# Patient Record
Sex: Female | Born: 1950 | Race: Black or African American | Hispanic: No | Smoking: Former smoker
Health system: Southern US, Community
[De-identification: ages and names within clinical notes are randomized; demographics above are authoritative.]

## PROBLEM LIST (undated history)

## (undated) HISTORY — PX: ABDOMINAL HYSTERECTOMY: SHX81

## (undated) HISTORY — PX: WRIST SURGERY: SHX841

---

## 2010-04-29 ENCOUNTER — Inpatient Hospital Stay: Payer: Self-pay | Admitting: Specialist

## 2015-01-03 ENCOUNTER — Emergency Department
Admission: EM | Admit: 2015-01-03 | Discharge: 2015-01-03 | Disposition: A | Discharge status: Home / Self Care | Payer: Worker's Compensation | Attending: Emergency Medicine | Admitting: Emergency Medicine

## 2015-01-03 ENCOUNTER — Emergency Department: Payer: Worker's Compensation

## 2015-01-03 ENCOUNTER — Encounter: Payer: Self-pay | Admitting: Emergency Medicine

## 2015-01-03 DIAGNOSIS — S79922A Unspecified injury of left thigh, initial encounter: Secondary | ICD-10-CM | POA: Diagnosis present

## 2015-01-03 DIAGNOSIS — W19XXXA Unspecified fall, initial encounter: Secondary | ICD-10-CM | POA: Diagnosis not present

## 2015-01-03 DIAGNOSIS — Y9289 Other specified places as the place of occurrence of the external cause: Secondary | ICD-10-CM | POA: Diagnosis not present

## 2015-01-03 DIAGNOSIS — W010XXA Fall on same level from slipping, tripping and stumbling without subsequent striking against object, initial encounter: Secondary | ICD-10-CM | POA: Insufficient documentation

## 2015-01-03 DIAGNOSIS — M79605 Pain in left leg: Secondary | ICD-10-CM | POA: Diagnosis not present

## 2015-01-03 DIAGNOSIS — Y93E5 Activity, floor mopping and cleaning: Secondary | ICD-10-CM | POA: Insufficient documentation

## 2015-01-03 DIAGNOSIS — Z87891 Personal history of nicotine dependence: Secondary | ICD-10-CM | POA: Diagnosis not present

## 2015-01-03 DIAGNOSIS — Y998 Other external cause status: Secondary | ICD-10-CM | POA: Diagnosis not present

## 2015-01-03 DIAGNOSIS — S76212A Strain of adductor muscle, fascia and tendon of left thigh, initial encounter: Secondary | ICD-10-CM | POA: Insufficient documentation

## 2015-01-03 DIAGNOSIS — S76219A Strain of adductor muscle, fascia and tendon of unspecified thigh, initial encounter: Secondary | ICD-10-CM

## 2015-01-03 MED ORDER — HYDROCODONE-ACETAMINOPHEN 5-325 MG PO TABS
2.0000 | ORAL_TABLET | Freq: Once | ORAL | Status: AC
  Administered 2015-01-03: 2 via ORAL
  Filled 2015-01-03: qty 2

## 2015-01-03 MED ORDER — HYDROCODONE-ACETAMINOPHEN 5-325 MG PO TABS: 1.0000 | ORAL_TABLET | Freq: Four times a day (QID) | ORAL | Status: DC | PRN

## 2015-01-03 NOTE — ED Notes (Signed)
NAD noted at time of discharge. Pt denies questions/concerns at this time. Pt taken to the lobby via wheelchair.

## 2015-01-03 NOTE — ED Notes (Signed)
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 C/o pain to inner L thigh, radiating to buttocks and lower left back.

## 2015-01-03 NOTE — Discharge Instructions (Signed)
Groin Strain A groin strain (also called a groin pull) is an injury to the muscles or tendon on the upper inner part of the thigh. These muscles are called the adductor muscles or groin muscles. They are responsible for moving the leg across the body. A muscle strain occurs when a muscle is overstretched and some muscle fibers are torn. A groin strain can range from mild to severe depending on how many muscle fibers are affected and whether the muscle fibers are partially or completely torn.  Groin strains usually occur during exercise or participation in sports. The injury often happens when a sudden, violent force is placed on a muscle, stretching the muscle too far. A strain is more likely to occur when your muscles are not warmed up or if you are not properly conditioned. Depending on the severity of the groin strain, recovery time may vary from a few weeks to several weeks. Severe injuries often require 4-6 weeks for recovery. In these cases, complete healing can take 4-5 months.  CAUSES   Stretching the groin muscles too far or too suddenly, often during side-to-side motion with an abrupt change in direction.  Putting repeated stress on the groin muscles over a long period of time.  Performing vigorous activity without properly stretching the groin muscles beforehand. SYMPTOMS   Pain and tenderness in the groin area. This begins as sharp pain and persists as a dull ache.  Popping or snapping feeling when the injury occurs (for severe strains).  Swelling or bruising.  Muscle spasms.  Weakness in the leg.  Stiffness in the groin area with decreased ability to move the affected muscles. DIAGNOSIS  Your caregiver will perform a physical exam to diagnose a groin strain. You will be asked about your symptoms and how the injury occurred. X-rays are sometimes needed to rule out a broken bone or cartilage problems. Your caregiver may order a CT scan or MRI if a complete muscle tear is  suspected. TREATMENT  A groin strain will often heal on its own. Your caregiver may prescribe medicines to help manage pain and swelling (anti-inflammatory medicine). You may be told to use crutches for the first few days to minimize your pain. HOME CARE INSTRUCTIONS   Rest. Do not use the strained muscle if it causes pain.  Put ice on the injured area.  Put ice in a plastic bag.  Place a towel between your skin and the bag.  Leave the ice on for 15-20 minutes, every 2-3 hours. Do this for the first 2 days after the injury.  Only take over-the-counter or prescription medicines as directed by your caregiver.  Wrap the injured area with an elastic bandage as directed by your caregiver.  Keep the injured leg raised (elevated).  Walk, stretch, and perform range-of-motion exercises to improve blood flow to the injured area. Only perform these activities if you can do so without any pain. To prevent muscle strains:  Warm up before exercise.  Develop proper conditioning and strength in the groin muscles. SEEK IMMEDIATE MEDICAL CARE IF:   You have increased pain or swelling in the affected area.   Your symptoms are not improving or are getting worse.  You have any cold or numb foot  You're unable to walk You develop a fever, numbness or weakness in the muscles of the leg MAKE SURE YOU:   Understand these instructions.  Will watch your condition.  Will get help right away if you are not doing well or get worse.  Document Released: 12/18/2003 Document Revised: 04/07/2012 Document Reviewed: 12/24/2011 [facility] Patient Information 2015 [place], [place]. This information is not intended to replace advice given to you by your health care provider. Make sure you discuss any questions you have with your health care provider.

## 2015-01-03 NOTE — ED Provider Notes (Signed)
[facility] Emergency Department Provider Note  ____________________________________________  Time seen: Approximately 1:07 PM  I have reviewed the triage vital signs and the nursing notes.   HISTORY  Chief Complaint Fall    HPI Hannah Edwards is a 64 y.o. female was working mopping a floor, when she slipped and essentially "did the splits". She's been having pain in her left thigh whenever she tries to walk since that time. She feels like she very much "pulled" all of her muscles. Denies any fevers or chills. No chest pain or trouble breathing. Reports she simply slipped on a slippery floor. She denies being in significant pain while sitting still, but her pain is significant only worse over the left inner thigh whenever she tries to move. [place].   History reviewed. No pertinent past medical history.  There are no active problems to display for this patient.   Past Surgical History  Procedure Laterality Date  . Abdominal hysterectomy      Current Outpatient Rx  Name  Route  Sig  Dispense  Refill  . HYDROcodone-acetaminophen (NORCO/VICODIN) 5-325 MG per tablet   Oral   Take 1 tablet by mouth every 6 (six) hours as needed for moderate pain.   15 tablet   0     Allergies Ace inhibitors  No family history on file.  Social History Social History  Substance Use Topics  . Smoking status: Former Games developer  . Smokeless tobacco: Never Used  . Alcohol Use: No    Review of Systems Constitutional: No fever/chills Eyes: No visual changes. ENT: No sore throat. Cardiovascular: Denies chest pain. Respiratory: Denies shortness of breath. Gastrointestinal: No abdominal pain.  No nausea, no vomiting.  No diarrhea.  No constipation. Genitourinary: Negative for dysuria. Musculoskeletal: Negative for back pain. No pain in the knees, ankles or feet. Skin: Negative for rash. Neurological: Negative for headaches, focal weakness or numbness.  10-point ROS  otherwise negative.  ____________________________________________   PHYSICAL EXAM:  VITAL SIGNS: ED Triage Vitals  Enc Vitals Group     BP 01/03/15 1028 117/74 mmHg     Pulse Rate 01/03/15 1028 87     Resp 01/03/15 1028 14     Temp 01/03/15 1028 97.6 F (36.4 C)     Temp Source 01/03/15 1028 Oral     SpO2 01/03/15 1028 96 %     Weight 01/03/15 1028 270 lb (122.471 kg)     Height 01/03/15 1028 5\' 5"  (1.651 m)     Head Cir --      Peak Flow --      Pain Score 01/03/15 1027 9     Pain Loc --      Pain Edu? --      Excl. in GC? --     Constitutional: Alert and oriented. Well appearing and in no acute distress. Eyes: Conjunctivae are normal. PERRL. EOMI. Head: Atraumatic. Nose: No congestion/rhinnorhea. Mouth/Throat: Mucous membranes are moist.  Oropharynx non-erythematous. Neck: No stridor.   Cardiovascular: Normal rate, regular rhythm. Grossly normal heart sounds.  Good peripheral circulation. Respiratory: Normal respiratory effort.  No retractions. Lungs CTAB. Gastrointestinal: Soft and nontender. No distention. No abdominal bruits. No CVA tenderness. Musculoskeletal: No lower extremity tenderness nor edema except along the left inner thigh to palpation.  No joint effusions. No deformities. Normal dorsalis pedis pulses bilaterally. No lumbar or thoracic back pain. No cervical spine tenderness. Neurologic:  Normal speech and language. No gross focal neurologic deficits are appreciated. No gait instability. Skin:  Skin is warm, dry and intact. No rash noted. Psychiatric: Mood and affect are normal. Speech and behavior are normal.  ____________________________________________   LABS (all labs ordered are listed, but only abnormal results are displayed)  Labs Reviewed - No data to display ____________________________________________  EKG   ____________________________________________  RADIOLOGY     DG FEMUR MIN 2 VIEWS LEFT (Final result) Result time: 01/03/15  12:25:18   Final result by Rad Results In Interface (01/03/15 12:25:18)   Narrative:   CLINICAL DATA: Left leg pain today after falling while mopping the floor.  EXAM: LEFT FEMUR 2 VIEWS  COMPARISON: None.  FINDINGS: The hip and knee joints are maintained. Bony density at the medial femoral condyle is likely a Pellegrini-Stieda lesion. No acute hip or femur fracture is identified. No knee joint effusion.  IMPRESSION: No acute bony findings. 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    ____________________________________________   PROCEDURES  Procedure(s) performed: None  Critical Care performed: No  ____________________________________________   INITIAL IMPRESSION / ASSESSMENT AND PLAN / ED COURSE  Pertinent labs & imaging results that were available during my care of the patient were reviewed by me and considered in my medical decision making (see chart for details).  Patient reports essentially "doing the splits", and based on her exam and history I believe that she has a likely muscular strain or tear involving the left proximal thigh muscles. Her exam is very reassuring, I discussed with her pain control and close return precautions. She'll follow-up with her primary care doctor in the next few days. No evidence of acute bony injury. Neurovascular intact.  I will prescribe the patient a narcotic pain medicine due to their condition which I anticipate will cause at least moderate pain short term. I discussed with the patient safe use of narcotic pain medicines, and that they are not to drive, work in dangerous areas, or ever take more than prescribed (no more than 1 pill every 6 hours). We discussed  the risks of this type of medicine. Patient is very agreeable to only use as prescribed and to never use more than prescribed.  ____________________________________________   FINAL CLINICAL IMPRESSION(S) / ED DIAGNOSES  Final  diagnoses:  Fall  Groin strain, initial encounter      [name], MD 01/03/15 1627

## 2015-01-04 DIAGNOSIS — I10 Essential (primary) hypertension: Secondary | ICD-10-CM | POA: Insufficient documentation

## 2015-01-04 DIAGNOSIS — K219 Gastro-esophageal reflux disease without esophagitis: Secondary | ICD-10-CM

## 2015-01-04 DIAGNOSIS — E669 Obesity, unspecified: Secondary | ICD-10-CM

## 2015-01-04 DIAGNOSIS — J309 Allergic rhinitis, unspecified: Secondary | ICD-10-CM

## 2015-01-04 DIAGNOSIS — M8580 Other specified disorders of bone density and structure, unspecified site: Secondary | ICD-10-CM | POA: Insufficient documentation

## 2015-01-04 DIAGNOSIS — E785 Hyperlipidemia, unspecified: Secondary | ICD-10-CM | POA: Insufficient documentation

## 2015-01-05 ENCOUNTER — Telehealth: Payer: Self-pay | Admitting: Unknown Physician Specialty

## 2015-01-05 ENCOUNTER — Ambulatory Visit (INDEPENDENT_AMBULATORY_CARE_PROVIDER_SITE_OTHER): Payer: Medicare Other | Admitting: Unknown Physician Specialty

## 2015-01-05 ENCOUNTER — Encounter: Payer: Self-pay | Admitting: Unknown Physician Specialty

## 2015-01-05 VITALS — BP 136/87 | HR 93 | Temp 98.8°F | Ht 64.1 in | Wt 275.0 lb

## 2015-01-05 DIAGNOSIS — K219 Gastro-esophageal reflux disease without esophagitis: Secondary | ICD-10-CM

## 2015-01-05 DIAGNOSIS — E785 Hyperlipidemia, unspecified: Secondary | ICD-10-CM

## 2015-01-05 DIAGNOSIS — E669 Obesity, unspecified: Secondary | ICD-10-CM

## 2015-01-05 DIAGNOSIS — I10 Essential (primary) hypertension: Secondary | ICD-10-CM

## 2015-01-05 DIAGNOSIS — R1030 Lower abdominal pain, unspecified: Secondary | ICD-10-CM | POA: Diagnosis not present

## 2015-01-05 DIAGNOSIS — R1032 Left lower quadrant pain: Secondary | ICD-10-CM

## 2015-01-05 LAB — MICROALBUMIN, URINE WAIVED
CREATININE, URINE WAIVED: 200 mg/dL (ref 10–300)
MICROALB, UR WAIVED: 30 mg/L — AB (ref 0–19)
Microalb/Creat Ratio: <30 mg/g (ref <30)

## 2015-01-05 LAB — LIPID PANEL PICCOLO, WAIVED
CHOL/HDL RATIO PICCOLO,WAIVE: 3.3 mg/dL
Cholesterol Piccolo, Waived: 186 mg/dL (ref <200)
HDL Chol Piccolo, Waived: 56 mg/dL — ABNORMAL LOW (ref >59)
LDL Chol Calc Piccolo Waived: 116 mg/dL — ABNORMAL HIGH (ref <100)
Triglycerides Piccolo,Waived: 73 mg/dL (ref <150)
VLDL Chol Calc Piccolo,Waive: 15 mg/dL (ref <30)

## 2015-01-05 MED ORDER — CARVEDILOL 12.5 MG PO TABS: 12.5000 mg | ORAL_TABLET | Freq: Two times a day (BID) | ORAL | Status: DC

## 2015-01-05 MED ORDER — ATORVASTATIN CALCIUM 10 MG PO TABS: 10.0000 mg | ORAL_TABLET | Freq: Every day | ORAL | Status: DC

## 2015-01-05 MED ORDER — OMEPRAZOLE 20 MG PO CPDR: 20.0000 mg | DELAYED_RELEASE_CAPSULE | Freq: Every day | ORAL | Status: DC

## 2015-01-05 NOTE — Assessment & Plan Note (Signed)
Stable, continue present medications.   

## 2015-01-05 NOTE — Addendum Note (Signed)
Addended by: Gabriel Cirri on: 01/05/2015 11:20 AM   Modules accepted: Orders

## 2015-01-05 NOTE — Progress Notes (Addendum)
BP 136/87 mmHg  Pulse 93  Temp(Src) 98.8 F (37.1 C)  Ht 5' 4.1" (1.628 m)  Wt 275 lb (124.739 kg)  BMI 47.06 kg/m2  SpO2 93%   Subjective:    Patient ID: Hannah Edwards, female    DOB: 02-11-1951, 64 y.o.   MRN: [id number]  HPI: Hannah Edwards is a 64 y.o. female  Chief Complaint  Patient presents with  . ER f/u    fell at work    This is an ER f/u in which she fell at work 2 days ago in which she "did a split".  She is having pain left inner groin.  It is getting better.  Some pain with movement certain ways.  X-ray were done at the hospital which were normal.  Given Vicoden in the ER.    Also lost to follow-up and needs meds refilled  Hypertension This is a chronic problem. The current episode started today. The problem is unchanged. The problem is controlled. Pertinent negatives include no anxiety, blurred vision, chest pain, headaches, malaise/fatigue, neck pain, orthopnea, palpitations, peripheral edema, PND or shortness of breath. Past treatments include nothing. There are no compliance problems.  There is no history of angina, kidney disease, CAD/MI, CVA, heart failure, left ventricular hypertrophy, PVD or retinopathy.  Gastrophageal Reflux She complains of belching. She reports no chest pain. This is a chronic problem. The heartburn duration is more than one hour. Nothing aggravates the symptoms. She has tried a PPI for the symptoms. The treatment provided significant relief.     Relevant past medical, surgical, family and social history reviewed and updated as indicated. Interim medical history since our last visit reviewed. Allergies and medications reviewed and updated.  Review of Systems  Constitutional: Negative for malaise/fatigue.  Eyes: Negative for blurred vision.  Respiratory: Negative for shortness of breath.   Cardiovascular: Negative for chest pain, palpitations, orthopnea and PND.  Musculoskeletal: Negative for neck pain.  Neurological: Negative for  headaches.    Per HPI unless specifically indicated above     Objective:    BP 136/87 mmHg  Pulse 93  Temp(Src) 98.8 F (37.1 C)  Ht 5' 4.1" (1.628 m)  Wt 275 lb (124.739 kg)  BMI 47.06 kg/m2  SpO2 93%  Wt Readings from Last 3 Encounters:  01/05/15 275 lb (124.739 kg)  08/26/12 252 lb (114.306 kg)  01/03/15 270 lb (122.471 kg)    Physical Exam  Constitutional: She is oriented to person, place, and time. She appears well-developed and well-nourished. No distress.  HENT:  Head: Normocephalic and atraumatic.  Eyes: Conjunctivae and lids are normal. Right eye exhibits no discharge. Left eye exhibits no discharge. No scleral icterus.  Cardiovascular: Normal rate, regular rhythm and normal heart sounds.   Pulmonary/Chest: Effort normal and breath sounds normal. No respiratory distress.  Abdominal: Normal appearance. There is no splenomegaly or hepatomegaly.  Musculoskeletal: Normal range of motion.  Neurological: She is alert and oriented to person, place, and time.  Skin: Skin is intact. No rash noted. No pallor.  Psychiatric: She has a normal mood and affect. Her behavior is normal. Judgment and thought content normal.    No results found for this or any previous visit.    Assessment & Plan:   Problem List Items Addressed This Visit      Unprioritized   GERD (gastroesophageal reflux disease) - Primary    Stable, continue present medications.       Hypertension    Stable, continue present medications.  Relevant Medications   atorvastatin (LIPITOR) 10 MG tablet   Other Relevant Orders   Microalbumin, Urine Waived   Uric acid   Comprehensive metabolic panel   Obesity    Pt ed      Hyperlipidemia    Check Lipid panel      Relevant Medications   atorvastatin (LIPITOR) 10 MG tablet   Other Relevant Orders   Lipid Panel Piccolo, Waived    Other Visit Diagnoses    Left groin pain        Much better.  Able to return to work        Follow up  plan: Return in about 3 months (around 04/06/2015) for for physical.

## 2015-01-05 NOTE — Telephone Encounter (Signed)
Routing to provider. Patient was just seen this morning. 

## 2015-01-05 NOTE — Telephone Encounter (Signed)
PT NEEDS REFILL ON COREG AND PRILOSEC SENT TO [facility]

## 2015-01-05 NOTE — Assessment & Plan Note (Addendum)
Reviewed  Lipid panel.  Start Atorvastatin 10 mg QD

## 2015-01-05 NOTE — Patient Instructions (Signed)
Eliminate sugar and processed food.    Try "Eat Fat Get Thin"  "Sugar Busters"  Think you're too busy to work out? We have the workout for you. In minutes, high-intensity interval training (H.I.I.T.) will have you sweating, breathing hard and maximizing the health benefits of exercise without the time commitment. Best of all, it's scientifically proven to work.  What Is H.I.I.T.? SHORT WORKOUTS 101 High-intensity interval training - referred to as H.I.I.T. - is based on the idea that short bursts of strenuous exercise can have a big impact on the body. If moderate exercise - like a 20-minute jog - is good for your heart, lungs and metabolism, H.I.I.T. packs the benefits of that workout and more into a few minutes. It may sound too good to be true, but learning this exercise technique and adapting it to your life can mean saving hours at the gym. If you think you don't have time to exercise, H.I.I.T. may be the workout for you.  You can try it with any aerobic activity you like. The principles of H.I.I.T. can be applied to running, biking, stair climbing, swimming, jumping rope, rowing, even hopping or skipping. (Yes, skipping!)  The downside? Even though H.I.I.T. lasts only minutes, the workouts are tough, requiring you to push your body near its limit.  HOW INTENSE IS HIGH INTENSITY? High-intensity exercise is obviously not a casual stroll down the street, but it's not a run-till-your-lungs-pop explosion, either. Think breathless, not winded. Heart-pounding, not exploding. Legs pumping, but not uncontrolled.  You don't need any fancy heart rate monitors to do these workouts. Use cues from your body as a guide. In the middle of a high-intensity workout you should be able to say single words, but not complete whole sentences. So, if you can keep chatting to your workout partner during this workout, pump it up a few notches.  02-21-29 Training This simple program will help you make the most of a  short workout by improving heart health and endurance. Try it with your favorite cardiovascular activity. The essentials of 02-21-29 training are simple. Run, ride or perhaps row on a rowing machine gently for 30 seconds, accelerate to a moderate pace for 20 seconds, then sprint as hard as you can for 10 seconds. (It should be called 30-20-10 training, obviously, but that is not as catchy.) Repeat.  You don't even need a stopwatch to monitor the 30-, 20-, and 10-second time changes. You can just count to yourself, which seems to make the intervals pass more quickly.  Best of all? The grueling, all-out portion of the workout lasts for only 10 seconds. C'mon, you can do anything for 10 seconds, right?  Got 10 Minutes? A solitary minute of hard work buried in 10 minutes of activity can make a big difference.  The 10-Minute Workout If you like to run, bike, row or swim - just a little bit - this workout is a great option for you. Step 1 Warm up for 2 minutes Step 2 Pedal, run or swim all-out for 20 seconds. Repeat 2 more times Warm down for 3 minutes    GET STARTED To benefit the most from really, really short workouts, you need to build the habit of doing them into your hectic life. Ideally, you'll complete the workout three times a week. The best way to build that habit is to start small and be willing to tweak your schedule where you can to accommodate your new workout.  First set up a spot in  your house for your workout, equipped with whatever you need to get the job done: sneakers, a chair, a towel, etc. Then slot your workout in before you would normally shower. (You can even do it in the bathroom.) Or wake up five minutes earlier and do it first thing in the morning, so you can head off to work feeling accomplished. Or do it during your lunch hour. Run up your office's stairs or grab a private conference room for just a few minutes. Or work it into your commute. If you walk or bike to work,  add some heavy intervals on the way home.  GET A BOOST FROM MUSIC Creating a workout playlist of high-energy tunes you love will not make your workout feel easier, but it may cause you to exercise harder without even realizing it. Best of all, if you are doing a really short workout, you need only one or two great tunes to get you through. If you are willing to try something a bit different, make your own music as you exercise. Sing, hum, clap your hands, whatever you can do to jam along to your playlist. It may give you an extra boost to finish strong.  Find a song or podcast that's the length of your really, really short workout. By the time the song is over, you're done.  Excerpted from the [place] Times Well column http://www.nytimes.com/well/guides/really-really-short-workouts?smid=fb-nytwell&smtyp=pay

## 2015-01-05 NOTE — Assessment & Plan Note (Signed)
Pt ed

## 2015-01-06 LAB — COMPREHENSIVE METABOLIC PANEL
ALBUMIN: 4.1 g/dL (ref 3.6–4.8)
ALT: 12 IU/L (ref 0–32)
AST: 16 IU/L (ref 0–40)
Albumin/Globulin Ratio: 1.5 (ref 1.1–2.5)
Alkaline Phosphatase: 73 IU/L (ref 39–117)
BUN / CREAT RATIO: 17 (ref 11–26)
BUN: 15 mg/dL (ref 8–27)
Bilirubin Total: 0.3 mg/dL (ref 0.0–1.2)
CALCIUM: 9.8 mg/dL (ref 8.7–10.3)
CHLORIDE: 104 mmol/L (ref 97–108)
CO2: 25 mmol/L (ref 18–29)
CREATININE: 0.9 mg/dL (ref 0.57–1.00)
GFR, EST AFRICAN AMERICAN: 78 mL/min/{1.73_m2} (ref >59)
GFR, EST NON AFRICAN AMERICAN: 67 mL/min/{1.73_m2} (ref >59)
GLUCOSE: 110 mg/dL — AB (ref 65–99)
Globulin, Total: 2.7 g/dL (ref 1.5–4.5)
Potassium: 4.9 mmol/L (ref 3.5–5.2)
Sodium: 144 mmol/L (ref 134–144)
TOTAL PROTEIN: 6.8 g/dL (ref 6.0–8.5)

## 2015-01-06 LAB — URIC ACID: URIC ACID: 6.3 mg/dL (ref 2.5–7.1)

## 2015-01-24 DIAGNOSIS — Z23 Encounter for immunization: Secondary | ICD-10-CM | POA: Diagnosis not present

## 2015-05-11 ENCOUNTER — Ambulatory Visit (INDEPENDENT_AMBULATORY_CARE_PROVIDER_SITE_OTHER): Payer: Medicare Other | Admitting: Unknown Physician Specialty

## 2015-05-11 ENCOUNTER — Encounter: Payer: Self-pay | Admitting: Unknown Physician Specialty

## 2015-05-11 VITALS — BP 142/83 | HR 81 | Temp 98.8°F | Ht 64.6 in | Wt 267.6 lb

## 2015-05-11 DIAGNOSIS — I10 Essential (primary) hypertension: Secondary | ICD-10-CM

## 2015-05-11 DIAGNOSIS — E669 Obesity, unspecified: Secondary | ICD-10-CM

## 2015-05-11 DIAGNOSIS — R7301 Impaired fasting glucose: Secondary | ICD-10-CM

## 2015-05-11 DIAGNOSIS — Z0000 Encounter for general adult medical examination without abnormal findings: Secondary | ICD-10-CM

## 2015-05-11 LAB — BAYER DCA HB A1C WAIVED: HB A1C (BAYER DCA - WAIVED): 6.3 % (ref <7.0)

## 2015-05-11 NOTE — Patient Instructions (Signed)
Please do call to schedule your mammogram; the number to schedule one at either [facility] or [facility] is [phone]   

## 2015-05-11 NOTE — Assessment & Plan Note (Signed)
Lost 8 pounds.  Continue working in it

## 2015-05-11 NOTE — Assessment & Plan Note (Signed)
A little high but good numbers at home.  Continue present treatment

## 2015-05-11 NOTE — Progress Notes (Signed)
BP 142/83 mmHg  Pulse 81  Temp(Src) 98.8 F (37.1 C)  Ht 5' 4.6" (1.641 m)  Wt 267 lb 9.6 oz (121.383 kg)  BMI 45.08 kg/m2  SpO2 95%  LMP  (LMP Unknown)   Subjective:    Patient ID: Hannah Edwards, female    DOB: 12/23/1950, 65 y.o.   MRN: [id number]  HPI: Hannah Edwards is a 65 y.o. female  Chief Complaint  Patient presents with  . Medicare Wellness   Functional Status Survey: Is the patient deaf or have difficulty hearing?: No Does the patient have difficulty seeing, even when wearing glasses/contacts?: No Does the patient have difficulty concentrating, remembering, or making decisions?: No Does the patient have difficulty walking or climbing stairs?: No Does the patient have difficulty dressing or bathing?: No Does the patient have difficulty doing errands alone such as visiting a doctor's office or shopping?: No  Fall Risk  05/11/2015  Falls in the past year? Yes  Number falls in past yr: 1  Injury with Fall? No   Depression screen PHQ 2/9 05/11/2015  Decreased Interest 0  Down, Depressed, Hopeless 0  PHQ - 2 Score 0   Hypertension Using medications without difficulty Average home BPs 120's/70's-80's   No problems or lightheadedness No chest pain with exertion or shortness of breath No Edema   Relevant past medical, surgical, family and social history reviewed and updated as indicated. Interim medical history since our last visit reviewed. Allergies and medications reviewed and updated.  Review of Systems  Constitutional: Negative.   HENT: Negative.   Eyes: Negative.   Respiratory: Negative.   Cardiovascular: Negative.   Gastrointestinal: Negative.   Endocrine: Negative.   Genitourinary: Negative.   Musculoskeletal: Negative.   Skin: Negative.   Allergic/Immunologic: Negative.   Neurological: Negative.   Hematological: Negative.   Psychiatric/Behavioral: Negative.     Per HPI unless specifically indicated above     Objective:    BP 142/83 mmHg   Pulse 81  Temp(Src) 98.8 F (37.1 C)  Ht 5' 4.6" (1.641 m)  Wt 267 lb 9.6 oz (121.383 kg)  BMI 45.08 kg/m2  SpO2 95%  LMP  (LMP Unknown)  Wt Readings from Last 3 Encounters:  05/11/15 267 lb 9.6 oz (121.383 kg)  01/05/15 275 lb (124.739 kg)  08/26/12 252 lb (114.306 kg)    Physical Exam  Constitutional: She is oriented to person, place, and time. She appears well-developed and well-nourished.  HENT:  Head: Normocephalic and atraumatic.  Eyes: Pupils are equal, round, and reactive to light. Right eye exhibits no discharge. Left eye exhibits no discharge. No scleral icterus.  Neck: Normal range of motion. Neck supple. Carotid bruit is not present. No thyromegaly present.  Cardiovascular: Normal rate, regular rhythm and normal heart sounds.  Exam reveals no gallop and no friction rub.   No murmur heard. Pulmonary/Chest: Effort normal and breath sounds normal. No respiratory distress. She has no wheezes. She has no rales.  Abdominal: Soft. Bowel sounds are normal. There is no tenderness. There is no rebound.  Genitourinary: No breast swelling, tenderness or discharge.  Musculoskeletal: Normal range of motion.  Lymphadenopathy:    She has no cervical adenopathy.  Neurological: She is alert and oriented to person, place, and time.  Skin: Skin is warm, dry and intact. No rash noted.  Psychiatric: She has a normal mood and affect. Her speech is normal and behavior is normal. Judgment and thought content normal. Cognition and memory are normal.    Assessment &  Plan:   Problem List Items Addressed This Visit      Unprioritized   Hypertension    A little high but good numbers at home.  Continue present treatment      Relevant Orders   Lipid Panel w/o Chol/HDL Ratio   Comprehensive metabolic panel   Obesity    Lost 8 pounds.  Continue working in it      Relevant Orders   Lipid Panel w/o Chol/HDL Ratio   Bayer DCA Hb A1c Waived   IFG (impaired fasting glucose)   Relevant Orders    Bayer DCA Hb A1c Waived    Other Visit Diagnoses    Routine general medical examination at a health care facility    -  Primary    Relevant Orders    EKG 12-Lead (Completed)    MM DIGITAL SCREENING BILATERAL    Hepatitis C antibody        Follow up plan: Return in about 6 months (around 11/08/2015).

## 2015-05-12 LAB — HEPATITIS C ANTIBODY: Hep C Virus Ab: <0.1 s/co ratio (ref 0.0–0.9)

## 2015-05-12 LAB — COMPREHENSIVE METABOLIC PANEL
A/G RATIO: 1.6 (ref 1.1–2.5)
ALT: 8 IU/L (ref 0–32)
AST: 14 IU/L (ref 0–40)
Albumin: 4 g/dL (ref 3.6–4.8)
Alkaline Phosphatase: 73 IU/L (ref 39–117)
BILIRUBIN TOTAL: 0.4 mg/dL (ref 0.0–1.2)
BUN/Creatinine Ratio: 13 (ref 11–26)
BUN: 11 mg/dL (ref 8–27)
CALCIUM: 9.5 mg/dL (ref 8.7–10.3)
CHLORIDE: 102 mmol/L (ref 96–106)
CO2: 23 mmol/L (ref 18–29)
Creatinine, Ser: 0.82 mg/dL (ref 0.57–1.00)
GFR, EST AFRICAN AMERICAN: 87 mL/min/{1.73_m2} (ref >59)
GFR, EST NON AFRICAN AMERICAN: 75 mL/min/{1.73_m2} (ref >59)
GLOBULIN, TOTAL: 2.5 g/dL (ref 1.5–4.5)
Glucose: 95 mg/dL (ref 65–99)
POTASSIUM: 4.1 mmol/L (ref 3.5–5.2)
SODIUM: 141 mmol/L (ref 134–144)
TOTAL PROTEIN: 6.5 g/dL (ref 6.0–8.5)

## 2015-05-12 LAB — LIPID PANEL W/O CHOL/HDL RATIO
Cholesterol, Total: 195 mg/dL (ref 100–199)
HDL: 46 mg/dL (ref >39)
LDL Calculated: 133 mg/dL — ABNORMAL HIGH (ref 0–99)
TRIGLYCERIDES: 82 mg/dL (ref 0–149)
VLDL Cholesterol Cal: 16 mg/dL (ref 5–40)

## 2015-05-15 ENCOUNTER — Encounter: Payer: Self-pay | Admitting: Unknown Physician Specialty

## 2015-10-03 ENCOUNTER — Other Ambulatory Visit: Payer: Self-pay | Admitting: Unknown Physician Specialty

## 2015-12-07 ENCOUNTER — Ambulatory Visit: Payer: Medicare Other | Admitting: Unknown Physician Specialty

## 2016-01-18 ENCOUNTER — Other Ambulatory Visit: Payer: Self-pay | Admitting: Unknown Physician Specialty

## 2016-01-18 NOTE — Telephone Encounter (Signed)
Your patient 

## 2016-01-25 ENCOUNTER — Ambulatory Visit: Payer: Medicare Other | Admitting: Unknown Physician Specialty

## 2016-05-09 ENCOUNTER — Ambulatory Visit (INDEPENDENT_AMBULATORY_CARE_PROVIDER_SITE_OTHER): Payer: Medicare Other | Admitting: Family Medicine

## 2016-05-09 ENCOUNTER — Encounter: Payer: Self-pay | Admitting: Family Medicine

## 2016-05-09 VITALS — BP 130/92 | HR 101 | Temp 98.8°F | Wt 275.0 lb

## 2016-05-09 DIAGNOSIS — J101 Influenza due to other identified influenza virus with other respiratory manifestations: Secondary | ICD-10-CM

## 2016-05-09 DIAGNOSIS — R509 Fever, unspecified: Secondary | ICD-10-CM | POA: Diagnosis not present

## 2016-05-09 LAB — VERITOR FLU A/B WAIVED
INFLUENZA A: POSITIVE — AB
INFLUENZA B: NEGATIVE

## 2016-05-09 MED ORDER — ALBUTEROL SULFATE HFA 108 (90 BASE) MCG/ACT IN AERS: 2.0000 | INHALATION_SPRAY | Freq: Four times a day (QID) | RESPIRATORY_TRACT | 0 refills | Status: DC | PRN

## 2016-05-09 MED ORDER — OSELTAMIVIR PHOSPHATE 75 MG PO CAPS: 75.0000 mg | ORAL_CAPSULE | Freq: Two times a day (BID) | ORAL | 0 refills | Status: DC

## 2016-05-09 NOTE — Progress Notes (Signed)
BP (!) 130/92   Pulse (!) 101   Temp 98.8 F (37.1 C)   Wt 275 lb (124.7 kg)   LMP  (LMP Unknown)   SpO2 94%   BMI 46.33 kg/m    Subjective:    Patient ID: Hannah Edwards, female    DOB: 11-Aug-1950, 66 y.o.   MRN: [id number]  HPI: Hannah Edwards is a 66 y.o. female  Chief Complaint  Patient presents with  . URI    x 4 days, aches, body pain, chest congestion, productive cough, some dizziness, ears hurt. Low grade temp Saturday.  No sore throat.   Dizziness, weakness, body aches, fever, chills, productive cough, wheezing, ear pain x 4 days. Denies CP, sore throat. Taking mucinex, robitussin DM, tylenol, and nyquil. Last dose of medicine was last night. The majority of her work is sick right now.   History reviewed. No pertinent past medical history.  Social History   Social History  . Marital status: Single    Spouse name: N/A  . Number of children: N/A  . Years of education: N/A   Occupational History  . Not on file.   Social History Main Topics  . Smoking status: Former Smoker    Types: Cigarettes    Quit date: 01/05/1975  . Smokeless tobacco: Never Used  . Alcohol use No  . Drug use: No  . Sexual activity: No   Other Topics Concern  . Not on file   Social History Narrative  . No narrative on file    Relevant past medical, surgical, family and social history reviewed and updated as indicated. Interim medical history since our last visit reviewed. Allergies and medications reviewed and updated.  Review of Systems  Constitutional: Positive for chills, diaphoresis, fatigue and fever.  HENT: Positive for congestion.   Eyes: Negative.   Respiratory: Positive for cough and wheezing.   Cardiovascular: Negative.   Gastrointestinal: Negative.   Genitourinary: Negative.   Musculoskeletal: Positive for myalgias.  Neurological: Positive for dizziness and weakness.  Psychiatric/Behavioral: Negative.     Per HPI unless specifically indicated above     Objective:    BP (!) 130/92   Pulse (!) 101   Temp 98.8 F (37.1 C)   Wt 275 lb (124.7 kg)   LMP  (LMP Unknown)   SpO2 94%   BMI 46.33 kg/m   Wt Readings from Last 3 Encounters:  05/09/16 275 lb (124.7 kg)  05/11/15 267 lb 9.6 oz (121.4 kg)  01/05/15 275 lb (124.7 kg)    Physical Exam  Constitutional: She is oriented to person, place, and time. She appears well-developed and well-nourished.  HENT:  Head: Atraumatic.  Right Ear: External ear normal.  Left Ear: External ear normal.  Oropharynx erythematous Nasal mucosa injected  Eyes: Conjunctivae are normal. Pupils are equal, round, and reactive to light.  Neck: Normal range of motion. Neck supple.  Cardiovascular: Normal rate and normal heart sounds.   Pulmonary/Chest: Effort normal. No respiratory distress. She has wheezes (moderate expiratory wheezes b/l - improved after nebulizer treatment).  Musculoskeletal: Normal range of motion.  Neurological: She is alert and oriented to person, place, and time.  Skin: Skin is warm and dry.  Psychiatric: She has a normal mood and affect. Her behavior is normal.  Nursing note and vitals reviewed.     Assessment & Plan:   Problem List Items Addressed This Visit    None    Visit Diagnoses    Influenza A    -  Primary   Modest improvement with nebulizer treatment in office. Pt wanting tamiflu despite being out of window. Will send albuterol for prn use as well.    Relevant Medications   oseltamivir (TAMIFLU) 75 MG capsule   Other Relevant Orders   Influenza A & B (STAT)       Follow up plan: Return if symptoms worsen or fail to improve, for work note today through next thursday.

## 2016-05-09 NOTE — Patient Instructions (Signed)
Follow up as needed

## 2016-05-16 ENCOUNTER — Encounter: Payer: Medicare Other | Admitting: Unknown Physician Specialty

## 2016-06-04 ENCOUNTER — Telehealth: Payer: Self-pay | Admitting: Unknown Physician Specialty

## 2016-06-04 NOTE — Telephone Encounter (Signed)
Called and let patient know what provider said.

## 2016-06-04 NOTE — Telephone Encounter (Signed)
Called and spoke with patient. She stated that she is having nasal congestion, sore throat and runny nose. States she had the flu a few weeks ago and these symptoms started Friday. Pharmacy is [facility].

## 2016-06-04 NOTE — Telephone Encounter (Signed)
Patient called to see if Hannah Edwards would call her in some medication for her cold.  She had an appt but she cancelled it due to not feeling well enough to come in the office.  Please advise.   Thanks

## 2016-06-04 NOTE — Telephone Encounter (Signed)
Let her know please that this sounds like a cold virus and antibiotics are not effective for this.  Lots of fluids and rest.

## 2016-06-04 NOTE — Telephone Encounter (Signed)
Called and left patient a VM asking for her to please return my call. We need to know what kind of symptoms she is having and how long she has not been feeling well.

## 2016-06-05 ENCOUNTER — Encounter: Payer: Self-pay | Admitting: Family Medicine

## 2016-12-10 ENCOUNTER — Encounter: Payer: Self-pay | Admitting: Unknown Physician Specialty

## 2016-12-10 ENCOUNTER — Ambulatory Visit (INDEPENDENT_AMBULATORY_CARE_PROVIDER_SITE_OTHER): Payer: Medicare Other | Admitting: Unknown Physician Specialty

## 2016-12-10 ENCOUNTER — Other Ambulatory Visit: Payer: Self-pay

## 2016-12-10 DIAGNOSIS — Z0000 Encounter for general adult medical examination without abnormal findings: Secondary | ICD-10-CM

## 2016-12-10 DIAGNOSIS — Z23 Encounter for immunization: Secondary | ICD-10-CM | POA: Diagnosis not present

## 2016-12-10 DIAGNOSIS — Z1231 Encounter for screening mammogram for malignant neoplasm of breast: Secondary | ICD-10-CM

## 2016-12-10 DIAGNOSIS — E7800 Pure hypercholesterolemia, unspecified: Secondary | ICD-10-CM | POA: Diagnosis not present

## 2016-12-10 DIAGNOSIS — E2839 Other primary ovarian failure: Secondary | ICD-10-CM | POA: Diagnosis not present

## 2016-12-10 DIAGNOSIS — I10 Essential (primary) hypertension: Secondary | ICD-10-CM | POA: Diagnosis not present

## 2016-12-10 DIAGNOSIS — R7301 Impaired fasting glucose: Secondary | ICD-10-CM | POA: Diagnosis not present

## 2016-12-10 DIAGNOSIS — H6122 Impacted cerumen, left ear: Secondary | ICD-10-CM

## 2016-12-10 DIAGNOSIS — Z1211 Encounter for screening for malignant neoplasm of colon: Secondary | ICD-10-CM

## 2016-12-10 DIAGNOSIS — Z6841 Body Mass Index (BMI) 40.0 and over, adult: Secondary | ICD-10-CM

## 2016-12-10 DIAGNOSIS — Z7189 Other specified counseling: Secondary | ICD-10-CM | POA: Diagnosis not present

## 2016-12-10 MED ORDER — CARVEDILOL 12.5 MG PO TABS: 12.5000 mg | ORAL_TABLET | Freq: Two times a day (BID) | ORAL | 1 refills | Status: DC

## 2016-12-10 MED ORDER — OMEPRAZOLE 20 MG PO CPDR: 20.0000 mg | DELAYED_RELEASE_CAPSULE | Freq: Every day | ORAL | 3 refills | Status: DC

## 2016-12-10 NOTE — Assessment & Plan Note (Signed)
Unable to irrigate.  Pt will try OTC products

## 2016-12-10 NOTE — Assessment & Plan Note (Signed)
A voluntary discussion about advance care planning including the explanation and discussion of advance directives was extensively discussed  with the patient.  Explanation about the health care proxy and Living will was reviewed   During this discussion, the patient was able to identify a health care proxy as her son and does not plan to fill out the paperwork required.  She feels her son will make the right decisions for her and no need for advance care planning.  Patient was offered a separate Advance Care Planning visit if need.

## 2016-12-10 NOTE — Assessment & Plan Note (Addendum)
Check lipid panel.  Atorvastatin made her body ache and tried 2 trials of and off.  Will consider another statin

## 2016-12-10 NOTE — Patient Instructions (Addendum)
Pneumococcal Conjugate Vaccine (PCV13) What You Need to Know 1. Why get vaccinated? Vaccination can protect both children and adults from pneumococcal disease. Pneumococcal disease is caused by bacteria that can spread from person to person through close contact. It can cause ear infections, and it can also lead to more serious infections of the:  Lungs (pneumonia),  Blood (bacteremia), and  Covering of the brain and spinal cord (meningitis).  Pneumococcal pneumonia is most common among adults. Pneumococcal meningitis can cause deafness and brain damage, and it kills about 1 child in 10 who get it. Anyone can get pneumococcal disease, but children under 81 years of age and adults 71 years and older, people with certain medical conditions, and cigarette smokers are at the highest risk. Before there was a vaccine, the [place] saw:  more than 700 cases of meningitis,  about 13,000 blood infections,  about 5 million ear infections, and  about 200 deaths  in children under 5 each year from pneumococcal disease. Since vaccine became available, severe pneumococcal disease in these children has fallen by 88%. About 18,000 older adults die of pneumococcal disease each year in the [place]. Treatment of pneumococcal infections with penicillin and other drugs is not as effective as it used to be, because some strains of the disease have become resistant to these drugs. This makes prevention of the disease, through vaccination, even more important. 2. PCV13 vaccine Pneumococcal conjugate vaccine (called PCV13) protects against 13 types of pneumococcal bacteria. PCV13 is routinely given to children at 2, 4, 6, and 65-16 months of age. It is also recommended for children and adults 44 to 23 years of age with certain health conditions, and for all adults 62 years of age and older. Your doctor can give you details. 3. Some people should not get this vaccine Anyone who has ever had a  life-threatening allergic reaction to a dose of this vaccine, to an earlier pneumococcal vaccine called PCV7, or to any vaccine containing diphtheria toxoid (for example, DTaP), should not get PCV13. Anyone with a severe allergy to any component of PCV13 should not get the vaccine. Tell your doctor if the person being vaccinated has any severe allergies. If the person scheduled for vaccination is not feeling well, your healthcare provider might decide to reschedule the shot on another day. 4. Risks of a vaccine reaction With any medicine, including vaccines, there is a chance of reactions. These are usually mild and go away on their own, but serious reactions are also possible. Problems reported following PCV13 varied by age and dose in the series. The most common problems reported among children were:  About half became drowsy after the shot, had a temporary loss of appetite, or had redness or tenderness where the shot was given.  About 1 out of 3 had swelling where the shot was given.  About 1 out of 3 had a mild fever, and about 1 in 20 had a fever over 102.85F.  Up to about 8 out of 10 became fussy or irritable.  Adults have reported pain, redness, and swelling where the shot was given; also mild fever, fatigue, headache, chills, or muscle pain. Young children who get PCV13 along with inactivated flu vaccine at the same time may be at increased risk for seizures caused by fever. Ask your doctor for more information. Problems that could happen after any vaccine:  People sometimes faint after a medical procedure, including vaccination. Sitting or lying down for about 15 minutes can help prevent  fainting, and injuries caused by a fall. Tell your doctor if you feel dizzy, or have vision changes or ringing in the ears.  Some older children and adults get severe pain in the shoulder and have difficulty moving the arm where a shot was given. This happens very rarely.  Any medication can cause a  severe allergic reaction. Such reactions from a vaccine are very rare, estimated at about 1 in a million doses, and would happen within a few minutes to a few hours after the vaccination. As with any medicine, there is a very small chance of a vaccine causing a serious injury or death. The safety of vaccines is always being monitored. For more information, visit: http://www.aguilar.org/ 5. What if there is a serious reaction? What should I look for? Look for anything that concerns you, such as signs of a severe allergic reaction, very high fever, or unusual behavior. Signs of a severe allergic reaction can include hives, swelling of the face and throat, difficulty breathing, a fast heartbeat, dizziness, and weakness-usually within a few minutes to a few hours after the vaccination. What should I do?  If you think it is a severe allergic reaction or other emergency that can't wait, call 9-1-1 or get the person to the nearest hospital. Otherwise, call your doctor.  Reactions should be reported to the Vaccine Adverse Event Reporting System (VAERS). Your doctor should file this report, or you can do it yourself through the VAERS web site at www.vaers.SamedayNews.es, or by calling 747-328-0361. ? VAERS does not give medical advice. 6. The National Vaccine Injury Compensation Program The [employer] Vaccine Injury Compensation Program (VICP) is a federal program that was created to compensate people who may have been injured by certain vaccines. Persons who believe they may have been injured by a vaccine can learn about the program and about filing a claim by calling 406-053-9291 or visiting the [place] website at GoldCloset.com.ee. There is a time limit to file a claim for compensation. 7. How can I learn more?  Ask your healthcare provider. He or she can give you the vaccine package insert or suggest other sources of information.  Call your local or state health department.  Contact the  Centers for Disease Control and Prevention (CDC): ? Call 519-145-6080 (1-800-CDC-INFO) or ? Visit CDC's website at http://hunter.com/ Vaccine Information Statement, PCV13 Vaccine (03/09/2014) This information is not intended to replace advice given to you by your health care provider. Make sure you discuss any questions you have with your health care provider. Document Released: 02/16/2006 Document Revised: 01/10/2016 Document Reviewed: 01/10/2016 Elsevier Interactive Patient Education  2017 Elsevier Inc. Pneumococcal Conjugate Vaccine (PCV13) What You Need to Know 1. Why get vaccinated? Vaccination can protect both children and adults from pneumococcal disease. Pneumococcal disease is caused by bacteria that can spread from person to person through close contact. It can cause ear infections, and it can also lead to more serious infections of the:  Lungs (pneumonia),  Blood (bacteremia), and  Covering of the brain and spinal cord (meningitis).  Pneumococcal pneumonia is most common among adults. Pneumococcal meningitis can cause deafness and brain damage, and it kills about 1 child in 10 who get it. Anyone can get pneumococcal disease, but children under 18 years of age and adults 66 years and older, people with certain medical conditions, and cigarette smokers are at the highest risk. Before there was a vaccine, the [place] saw:  more than 700 cases of meningitis,  about 13,000 blood infections,  about 5 million ear infections, and  about 200 deaths  in children under 5 each year from pneumococcal disease. Since vaccine became available, severe pneumococcal disease in these children has fallen by 88%. About 18,000 older adults die of pneumococcal disease each year in the [place]. Treatment of pneumococcal infections with penicillin and other drugs is not as effective as it used to be, because some strains of the disease have become resistant to these drugs. This makes  prevention of the disease, through vaccination, even more important. 2. PCV13 vaccine Pneumococcal conjugate vaccine (called PCV13) protects against 13 types of pneumococcal bacteria. PCV13 is routinely given to children at 2, 4, 6, and 7-17 months of age. It is also recommended for children and adults 84 to 75 years of age with certain health conditions, and for all adults 71 years of age and older. Your doctor can give you details. 3. Some people should not get this vaccine Anyone who has ever had a life-threatening allergic reaction to a dose of this vaccine, to an earlier pneumococcal vaccine called PCV7, or to any vaccine containing diphtheria toxoid (for example, DTaP), should not get PCV13. Anyone with a severe allergy to any component of PCV13 should not get the vaccine. Tell your doctor if the person being vaccinated has any severe allergies. If the person scheduled for vaccination is not feeling well, your healthcare provider might decide to reschedule the shot on another day. 4. Risks of a vaccine reaction With any medicine, including vaccines, there is a chance of reactions. These are usually mild and go away on their own, but serious reactions are also possible. Problems reported following PCV13 varied by age and dose in the series. The most common problems reported among children were:  About half became drowsy after the shot, had a temporary loss of appetite, or had redness or tenderness where the shot was given.  About 1 out of 3 had swelling where the shot was given.  About 1 out of 3 had a mild fever, and about 1 in 20 had a fever over 102.80F.  Up to about 8 out of 10 became fussy or irritable.  Adults have reported pain, redness, and swelling where the shot was given; also mild fever, fatigue, headache, chills, or muscle pain. Young children who get PCV13 along with inactivated flu vaccine at the same time may be at increased risk for seizures caused by fever. Ask your doctor  for more information. Problems that could happen after any vaccine:  People sometimes faint after a medical procedure, including vaccination. Sitting or lying down for about 15 minutes can help prevent fainting, and injuries caused by a fall. Tell your doctor if you feel dizzy, or have vision changes or ringing in the ears.  Some older children and adults get severe pain in the shoulder and have difficulty moving the arm where a shot was given. This happens very rarely.  Any medication can cause a severe allergic reaction. Such reactions from a vaccine are very rare, estimated at about 1 in a million doses, and would happen within a few minutes to a few hours after the vaccination. As with any medicine, there is a very small chance of a vaccine causing a serious injury or death. The safety of vaccines is always being monitored. For more information, visit: http://www.aguilar.org/ 5. What if there is a serious reaction? What should I look for? Look for anything that concerns you, such as signs of a severe allergic reaction, very high  fever, or unusual behavior. Signs of a severe allergic reaction can include hives, swelling of the face and throat, difficulty breathing, a fast heartbeat, dizziness, and weakness-usually within a few minutes to a few hours after the vaccination. What should I do?  If you think it is a severe allergic reaction or other emergency that can't wait, call 9-1-1 or get the person to the nearest hospital. Otherwise, call your doctor.  Reactions should be reported to the Vaccine Adverse Event Reporting System (VAERS). Your doctor should file this report, or you can do it yourself through the VAERS web site at www.vaers.SamedayNews.es, or by calling (609)156-8661. ? VAERS does not give medical advice. 6. The National Vaccine Injury Compensation Program The [employer] Vaccine Injury Compensation Program (VICP) is a federal program that was created to compensate people who may have  been injured by certain vaccines. Persons who believe they may have been injured by a vaccine can learn about the program and about filing a claim by calling (567)634-1712 or visiting the [place] website at GoldCloset.com.ee. There is a time limit to file a claim for compensation. 7. How can I learn more?  Ask your healthcare provider. He or she can give you the vaccine package insert or suggest other sources of information.  Call your local or state health department.  Contact the Centers for Disease Control and Prevention (CDC): ? Call 306-517-1138 (1-800-CDC-INFO) or ? Visit CDC's website at http://hunter.com/ Vaccine Information Statement, PCV13 Vaccine (03/09/2014) This information is not intended to replace advice given to you by your health care provider. Make sure you discuss any questions you have with your health care provider. ----------------------------------------------------- Please do call to schedule your mammogram and bone density; the number to schedule one at either [facility] or [facility] is [phone] --------------------------------------------------------   Preventive Care 3 Years and Older, Female Preventive care refers to lifestyle choices and visits with your health care provider that can promote health and wellness. What does preventive care include?  A yearly physical exam. This is also called an annual well check.  Dental exams once or twice a year.  Routine eye exams. Ask your health care provider how often you should have your eyes checked.  Personal lifestyle choices, including: ? Daily care of your teeth and gums. ? Regular physical activity. ? Eating a healthy diet. ? Avoiding tobacco and drug use. ? Limiting alcohol use. ? Practicing safe sex. ? Taking low-dose aspirin every day. ? Taking vitamin and mineral supplements as recommended by your health care provider. What happens during an annual  well check? The services and screenings done by your health care provider during your annual well check will depend on your age, overall health, lifestyle risk factors, and family history of disease. Counseling Your health care provider may ask you questions about your:  Alcohol use.  Tobacco use.  Drug use.  Emotional well-being.  Home and relationship well-being.  Sexual activity.  Eating habits.  History of falls.  Memory and ability to understand (cognition).  Work and work Statistician.  Reproductive health.  Screening You may have the following tests or measurements:  Height, weight, and BMI.  Blood pressure.  Lipid and cholesterol levels. These may be checked every 5 years, or more frequently if you are over 85 years old.  Skin check.  Lung cancer screening. You may have this screening every year starting at age 35 if you have a 30-pack-year history of smoking and currently smoke or have quit within  the past 15 years.  Fecal occult blood test (FOBT) of the stool. You may have this test every year starting at age 54.  Flexible sigmoidoscopy or colonoscopy. You may have a sigmoidoscopy every 5 years or a colonoscopy every 10 years starting at age 80.  Hepatitis C blood test.  Hepatitis B blood test.  Sexually transmitted disease (STD) testing.  Diabetes screening. This is done by checking your blood sugar (glucose) after you have not eaten for a while (fasting). You may have this done every 1-3 years.  Bone density scan. This is done to screen for osteoporosis. You may have this done starting at age 12.  Mammogram. This may be done every 1-2 years. Talk to your health care provider about how often you should have regular mammograms.  Talk with your health care provider about your test results, treatment options, and if necessary, the need for more tests. Vaccines Your health care provider may recommend certain vaccines, such as:  Influenza vaccine. This  is recommended every year.  Tetanus, diphtheria, and acellular pertussis (Tdap, Td) vaccine. You may need a Td booster every 10 years.  Varicella vaccine. You may need this if you have not been vaccinated.  Zoster vaccine. You may need this after age 35.  Measles, mumps, and rubella (MMR) vaccine. You may need at least one dose of MMR if you were born in 1957 or later. You may also need a second dose.  Pneumococcal 13-valent conjugate (PCV13) vaccine. One dose is recommended after age 64.  Pneumococcal polysaccharide (PPSV23) vaccine. One dose is recommended after age 46.  Meningococcal vaccine. You may need this if you have certain conditions.  Hepatitis A vaccine. You may need this if you have certain conditions or if you travel or work in places where you may be exposed to hepatitis A.  Hepatitis B vaccine. You may need this if you have certain conditions or if you travel or work in places where you may be exposed to hepatitis B.  Haemophilus influenzae type b (Hib) vaccine. You may need this if you have certain conditions.  Talk to your health care provider about which screenings and vaccines you need and how often you need them. This information is not intended to replace advice given to you by your health care provider. Make sure you discuss any questions you have with your health care provider. Document Released: 05/18/2015 Document Revised: 01/09/2016 Document Reviewed: 02/20/2015 Elsevier Interactive Patient Education  2017 [employer].

## 2016-12-10 NOTE — Addendum Note (Signed)
Addended by: Gabriel CirriWICKER, Terita Hejl on: 12/10/2016 11:33 AM   Modules accepted: Orders

## 2016-12-10 NOTE — Assessment & Plan Note (Signed)
Good numbers at home.  Will continue present medications

## 2016-12-10 NOTE — Progress Notes (Signed)
BP (!) 141/93   Pulse 86   Temp 98.3 F (36.8 C)   Ht 5' 4.2" (1.631 m)   Wt 275 lb 6.4 oz (124.9 kg)   LMP  (LMP Unknown)   SpO2 93%   BMI 46.98 kg/m    Subjective:    Patient ID: Hannah Edwards, female    DOB: [birth year], 66 y.o.   MRN: [id number]  HPI: Hannah Edwards is a 66 y.o. female  Chief Complaint  Patient presents with  . Medicare Wellness  . Ear Pain    pt states that ever since she had the flu in January, her left ear hurts off and on   Functional Status Survey: Is the patient deaf or have difficulty hearing?: No Does the patient have difficulty seeing, even when wearing glasses/contacts?: No Does the patient have difficulty concentrating, remembering, or making decisions?: No Does the patient have difficulty walking or climbing stairs?: No Does the patient have difficulty dressing or bathing?: No Does the patient have difficulty doing errands alone such as visiting a doctor's office or shopping?: No   Fall Risk  12/10/2016 05/11/2015  Falls in the past year? No Yes  Number falls in past yr: - 1  Injury with Fall? - No    [facility] 12/10/2016 05/11/2015  Decreased Interest 0 0  Down, Depressed, Hopeless 0 0  PHQ - 2 Score 0 0  Altered sleeping 0 -  Tired, decreased energy 0 -  Change in appetite 0 -  Feeling bad or failure about yourself  0 -  Trouble concentrating 0 -  Moving slowly or fidgety/restless 0 -  Suicidal thoughts 0 -  PHQ-9 Score 0 -   Hypertension Using medications without difficulty Average home BPs 118/83 at home  No problems or lightheadedness No chest pain with exertion or shortness of breath No Edema  Hyperlipidemia States Lipitor makes her body ache.  Tried 2 trials.   No Muscle aches  Diet compliance: kept off 10 pounds she lost Exercise: membership at [employer]  Social History   Social History  . Marital status: Single    Spouse name: N/A  . Number of children: N/A  . Years of education: N/A    Occupational History  . Not on file.   Social History Main Topics  . Smoking status: Former Smoker    Types: Cigarettes    Quit date: 01/05/1975  . Smokeless tobacco: Never Used  . Alcohol use No  . Drug use: No  . Sexual activity: No   Other Topics Concern  . Not on file   Social History Narrative  . No narrative on file   Family History  Problem Relation Age of Onset  . Hypertension Mother   . Cancer Mother   . Hypertension Father   . Aneurysm Father   . Cancer Sister   . Diabetes Brother   . HIV/AIDS Brother    Past Surgical History:  Procedure Laterality Date  . ABDOMINAL HYSTERECTOMY    . WRIST SURGERY Left     Relevant past medical, surgical, family and social history reviewed and updated as indicated. Interim medical history since our last visit reviewed. Allergies and medications reviewed and updated.  Review of Systems  HENT: Positive for ear pain.        Left ear pain as above    Per HPI unless specifically indicated above     Objective:    BP (!) 141/93   Pulse 86  Temp 98.3 F (36.8 C)   Ht 5' 4.2" (1.631 m)   Wt 275 lb 6.4 oz (124.9 kg)   LMP  (LMP Unknown)   SpO2 93%   BMI 46.98 kg/m   Wt Readings from Last 3 Encounters:  12/10/16 275 lb 6.4 oz (124.9 kg)  05/09/16 275 lb (124.7 kg)  05/11/15 267 lb 9.6 oz (121.4 kg)    Physical Exam  Constitutional: She is oriented to person, place, and time. She appears well-developed and well-nourished.  HENT:  Head: Normocephalic and atraumatic.  Eyes: Pupils are equal, round, and reactive to light. Right eye exhibits no discharge. Left eye exhibits no discharge. No scleral icterus.  Neck: Normal range of motion. Neck supple. Carotid bruit is not present. No thyromegaly present.  Cardiovascular: Normal rate, regular rhythm and normal heart sounds.  Exam reveals no gallop and no friction rub.   No murmur heard. Pulmonary/Chest: Effort normal and breath sounds normal. No respiratory distress.  She has no wheezes. She has no rales.  Abdominal: Soft. Bowel sounds are normal. There is no tenderness. There is no rebound.  Genitourinary: No breast swelling, tenderness or discharge.  Musculoskeletal: Normal range of motion.  Lymphadenopathy:    She has no cervical adenopathy.  Neurological: She is alert and oriented to person, place, and time.  Skin: Skin is warm, dry and intact. No rash noted.  Psychiatric: She has a normal mood and affect. Her speech is normal and behavior is normal. Judgment and thought content normal. Cognition and memory are normal.    Results for orders placed or performed in visit on 05/09/16  Influenza A & B (STAT)  Result Value Ref Range   Influenza A Positive (A) Negative   Influenza B Negative Negative      Assessment & Plan:   Problem List Items Addressed This Visit      Unprioritized   Advanced care planning/counseling discussion    A voluntary discussion about advance care planning including the explanation and discussion of advance directives was extensively discussed  with the patient.  Explanation about the health care proxy and Living will was reviewed   During this discussion, the patient was able to identify a health care proxy as her son and does not plan to fill out the paperwork required.  She feels her son will make the right decisions for her and no need for advance care planning.  Patient was offered a separate Advance Care Planning visit if need.         Ceruminosis, left    Unable to irrigate.  Pt will try OTC products      Hyperlipidemia    Check lipid panel.  Atorvastatin made her body ache and tried 2 trials of and off.  Will consider another statin      Relevant Orders   Lipid Panel w/o Chol/HDL Ratio   Hypertension    Good numbers at home.  Will continue present medications      IFG (impaired fasting glucose)   Relevant Orders   Bayer DCA Hb A1c Waived   Obesity   Relevant Orders   Lipid Panel w/o Chol/HDL Ratio     Other Visit Diagnoses    Need for pneumococcal vaccination    -  Primary   Relevant Orders   Pneumococcal conjugate vaccine 13-valent IM (Completed)   Routine general medical examination at a health care facility       Relevant Orders   Cologuard   Ovarian failure  Relevant Orders   DG Bone Density   Breast cancer screening by mammogram       Relevant Orders   MM DIGITAL SCREENING BILATERAL   Colon cancer screening       Relevant Orders   Cologuard       Follow up plan: Return in about 2 days (around 12/12/2016).

## 2016-12-11 ENCOUNTER — Emergency Department: Payer: Medicare Other

## 2016-12-11 ENCOUNTER — Encounter: Payer: Self-pay | Admitting: Emergency Medicine

## 2016-12-11 ENCOUNTER — Telehealth: Payer: Self-pay | Admitting: Unknown Physician Specialty

## 2016-12-11 ENCOUNTER — Emergency Department
Admission: EM | Admit: 2016-12-11 | Discharge: 2016-12-11 | Disposition: A | Discharge status: Home / Self Care | Payer: Medicare Other | Attending: Emergency Medicine | Admitting: Emergency Medicine

## 2016-12-11 DIAGNOSIS — Z79899 Other long term (current) drug therapy: Secondary | ICD-10-CM | POA: Insufficient documentation

## 2016-12-11 DIAGNOSIS — R899 Unspecified abnormal finding in specimens from other organs, systems and tissues: Secondary | ICD-10-CM

## 2016-12-11 DIAGNOSIS — I10 Essential (primary) hypertension: Secondary | ICD-10-CM | POA: Insufficient documentation

## 2016-12-11 DIAGNOSIS — R799 Abnormal finding of blood chemistry, unspecified: Secondary | ICD-10-CM | POA: Insufficient documentation

## 2016-12-11 DIAGNOSIS — R0789 Other chest pain: Secondary | ICD-10-CM | POA: Diagnosis not present

## 2016-12-11 DIAGNOSIS — Z87891 Personal history of nicotine dependence: Secondary | ICD-10-CM | POA: Insufficient documentation

## 2016-12-11 LAB — CBC
HEMATOCRIT: 40.2 % (ref 35.0–47.0)
HEMOGLOBIN: 13.3 g/dL (ref 12.0–16.0)
MCH: 29.6 pg (ref 26.0–34.0)
MCHC: 33.2 g/dL (ref 32.0–36.0)
MCV: 89.3 fL (ref 80.0–100.0)
Platelets: 383 10*3/uL (ref 150–440)
RBC: 4.5 MIL/uL (ref 3.80–5.20)
RDW: 14.8 % — ABNORMAL HIGH (ref 11.5–14.5)
WBC: 7.4 10*3/uL (ref 3.6–11.0)

## 2016-12-11 LAB — COMPREHENSIVE METABOLIC PANEL
ALBUMIN: 4.2 g/dL (ref 3.6–4.8)
ALT: 9 IU/L (ref 0–32)
AST: 39 IU/L (ref 0–40)
Albumin/Globulin Ratio: 1.3 (ref 1.2–2.2)
Alkaline Phosphatase: 78 IU/L (ref 39–117)
BUN/Creatinine Ratio: 18 (ref 12–28)
BUN: 15 mg/dL (ref 8–27)
Bilirubin Total: 0.4 mg/dL (ref 0.0–1.2)
CALCIUM: 9.5 mg/dL (ref 8.7–10.3)
CHLORIDE: 97 mmol/L (ref 96–106)
CO2: 18 mmol/L — AB (ref 20–29)
CREATININE: 0.83 mg/dL (ref 0.57–1.00)
GFR, EST AFRICAN AMERICAN: 84 mL/min/{1.73_m2} (ref >59)
GFR, EST NON AFRICAN AMERICAN: 73 mL/min/{1.73_m2} (ref >59)
Globulin, Total: 3.2 g/dL (ref 1.5–4.5)
Glucose: 87 mg/dL (ref 65–99)
Potassium: 6.7 mmol/L (ref 3.5–5.2)
Sodium: 134 mmol/L (ref 134–144)
TOTAL PROTEIN: 7.4 g/dL (ref 6.0–8.5)

## 2016-12-11 LAB — BASIC METABOLIC PANEL
ANION GAP: 8 (ref 5–15)
BUN: 16 mg/dL (ref 6–20)
CALCIUM: 9.3 mg/dL (ref 8.9–10.3)
CHLORIDE: 102 mmol/L (ref 101–111)
CO2: 27 mmol/L (ref 22–32)
Creatinine, Ser: 1 mg/dL (ref 0.44–1.00)
GFR calc non Af Amer: 57 mL/min — ABNORMAL LOW (ref >60)
Glucose, Bld: 128 mg/dL — ABNORMAL HIGH (ref 65–99)
POTASSIUM: 4.1 mmol/L (ref 3.5–5.1)
Sodium: 137 mmol/L (ref 135–145)

## 2016-12-11 LAB — LIPID PANEL W/O CHOL/HDL RATIO
Cholesterol, Total: 177 mg/dL (ref 100–199)
HDL: 43 mg/dL (ref >39)
LDL Calculated: 119 mg/dL — ABNORMAL HIGH (ref 0–99)
Triglycerides: 74 mg/dL (ref 0–149)
VLDL CHOLESTEROL CAL: 15 mg/dL (ref 5–40)

## 2016-12-11 LAB — HGB A1C W/O EAG: Hgb A1c MFr Bld: 6.3 % — ABNORMAL HIGH (ref 4.8–5.6)

## 2016-12-11 LAB — TROPONIN I

## 2016-12-11 NOTE — ED Provider Notes (Signed)
[facility] Emergency Department Provider Note  ____________________________________________   First MD Initiated Contact with Patient 12/11/16 1708     (approximate)  I have reviewed the triage vital signs and the nursing notes.   HISTORY  Chief Complaint abnormal labs    HPI Hannah Edwards is a 66 y.o. female Who presents by private vehicle for evaluation of possible hyperkalemia.  She was seen yesterday by her primary care provider, Gabriel Cirri, for an annual appointment.  Blood work was sent and she received a phone call today hat she should go to the emergency department because her potassium was 6.7.  She states that she has some discomfort in her ears and a serum and impaction on the left side for which she is going back to Ms. [facility]icker tomorrow, but she adamantly denies chest pain, shortness of breath, nausea, vomiting, abdominal pain.  She has had no problems with her kidneys in the past.  She has no symptoms for which to describe  a severity or time of onset.  History reviewed. No pertinent past medical history.  Patient Active Problem List   Diagnosis Date Noted  . Ceruminosis, left 12/10/2016  . Advanced care planning/counseling discussion 12/10/2016  . IFG (impaired fasting glucose) 05/11/2015  . GERD (gastroesophageal reflux disease) 01/04/2015  . Hypertension 01/04/2015  . Obesity 01/04/2015  . Osteopenia 01/04/2015  . Hyperlipidemia 01/04/2015  . Allergic rhinitis 01/04/2015    Past Surgical History:  Procedure Laterality Date  . ABDOMINAL HYSTERECTOMY    . WRIST SURGERY Left     Prior to Admission medications   Medication Sig Start Date End Date Taking? Authorizing Provider  albuterol (PROVENTIL HFA;VENTOLIN HFA) 108 (90 Base) MCG/ACT inhaler Inhale 2 puffs into the lungs every 6 (six) hours as needed for wheezing or shortness of breath. 05/09/16   Particia Nearing, PA-C  carvedilol (COREG) 12.5 MG tablet Take 1 tablet (12.5 mg  total) by mouth 2 (two) times daily with a meal. 12/10/16   Gabriel Cirri, NP  omeprazole (PRILOSEC) 20 MG capsule Take 1 capsule (20 mg total) by mouth daily. 12/10/16   Gabriel Cirri, NP    Allergies Ace inhibitors; Atorvastatin; and Lisinopril  Family History  Problem Relation Age of Onset  . Hypertension Mother   . Cancer Mother   . Hypertension Father   . Aneurysm Father   . Cancer Sister   . Diabetes Brother   . HIV/AIDS Brother     Social History Social History  Substance Use Topics  . Smoking status: Former Smoker    Types: Cigarettes    Quit date: 01/05/1975  . Smokeless tobacco: Never Used  . Alcohol use No    Review of Systems Constitutional: No fever/chills Cardiovascular: Denies chest pain. Respiratory: Denies shortness of breath. Gastrointestinal: No abdominal pain.  No nausea, no vomiting.  No diarrhea.  No constipation. Integumentary: Negative for rash. Neurological: Negative for headaches, focal weakness or numbness.  ____________________________________________   PHYSICAL EXAM:  VITAL SIGNS: ED Triage Vitals  Enc Vitals Group     BP 12/11/16 1452 136/75     Pulse Rate 12/11/16 1452 98     Resp 12/11/16 1452 20     Temp 12/11/16 1452 98.9 F (37.2 C)     Temp Source 12/11/16 1452 Oral     SpO2 12/11/16 1452 96 %     Weight 12/11/16 1453 124.7 kg (275 lb)     Height 12/11/16 1453 1.626 m (5\' 4" )  Head Circumference --      Peak Flow --      Pain Score 12/11/16 1458 0     Pain Loc --      Pain Edu? --      Excl. in GC? --     Constitutional: Alert and oriented. Well appearing and in no acute distress. Eyes: Conjunctivae are normal.  Head: Atraumatic. Mouth/Throat: Mucous membranes are moist. Neck: No stridor.  No meningeal signs.   Cardiovascular: Normal rate, regular rhythm. Good peripheral circulation. Grossly normal heart sounds. Respiratory: Normal respiratory effort.  No retractions. Lungs CTAB. Gastrointestinal: Soft and  nontender. No distention.  morbid obesity. Musculoskeletal: No lower extremity tenderness nor edema. No gross deformities of extremities. Neurologic:  Normal speech and language. No gross focal neurologic deficits are appreciated.  Skin:  Skin is warm, dry and intact. No rash noted. Psychiatric: Mood and affect are normal. Speech and behavior are normal.  ____________________________________________   LABS (all labs ordered are listed, but only abnormal results are displayed)  Labs Reviewed  BASIC METABOLIC PANEL - Abnormal; Notable for the following:       Result Value   Glucose, Bld 128 (*)    GFR calc non Af Amer 57 (*)    All other components within normal limits  CBC - Abnormal; Notable for the following:    RDW 14.8 (*)    All other components within normal limits  TROPONIN I   ____________________________________________  EKG  ED ECG REPORT I, Baylin Cabal, the attending physician, personally viewed and interpreted this ECG.  Date: 12/11/2016 EKG Time: 14:54 Rate: 102 Rhythm: borderline tachycardia QRS Axis: normal Intervals: normal ST/T Wave abnormalities: normal with normal T-waves Narrative Interpretation: unremarkable  ____________________________________________  RADIOLOGY   Dg Chest 2 View  Result Date: 12/11/2016 CLINICAL DATA:  Pt had dr appt yesterday, dr called this morning stating potassium levels were high and to come to ED. No chest complaints. Hx of HBP. EXAM: CHEST  2 VIEW COMPARISON:  Normal mediastinum and cardiac silhouette. Normal pulmonary vasculature. No evidence of effusion, infiltrate, or pneumothorax. No acute bony abnormality. FINDINGS: The heart size and mediastinal contours are within normal limits. Both lungs are clear. The visualized skeletal structures are unremarkable. Degenerative osteophytosis of the spine. IMPRESSION: No acute cardiopulmonary process. Electronically Signed   By: Genevive Bi M.D.   On: 12/11/2016 15:31     ____________________________________________   PROCEDURES  Critical Care performed: No   Procedure(s) performed:   Procedures   ____________________________________________   INITIAL IMPRESSION / ASSESSMENT AND PLAN / ED COURSE  Pertinent labs & imaging results that were available during my care of the patient were reviewed by me and considered in my medical decision making (see chart for details).  I reviewed the EMR and saw in the records that the sample reported as 6.7 was also noted to be hemolyzed.  Repeat potassium today was within normal limits and her EKG was reassuring.  I provided the good news to her and we both agree that no further workup was necessary.  She will follow up tomorrow for the ongoing cerumen impaction.      ____________________________________________  FINAL CLINICAL IMPRESSION(S) / ED DIAGNOSES  Final diagnoses:  Abnormal laboratory test result     MEDICATIONS GIVEN DURING THIS VISIT:  Medications - No data to display   NEW OUTPATIENT MEDICATIONS STARTED DURING THIS VISIT:  Discharge Medication List as of 12/11/2016  5:31 PM      Discharge Medication List  as of 12/11/2016  5:31 PM      Discharge Medication List as of 12/11/2016  5:31 PM       Note:  This document was prepared using Dragon voice recognition software and may include unintentional dictation errors.    Loleta RoseForbach, Arshia Rondon, MD 12/11/16 [phone]

## 2016-12-11 NOTE — ED Triage Notes (Signed)
Pt sent over from PCP for further eval of hight potassium and chest pain

## 2016-12-11 NOTE — Telephone Encounter (Signed)
Called and advised patient to go to E.R. For elevated potassium.

## 2016-12-11 NOTE — Discharge Instructions (Signed)
Fortunately your potassium was normal today in the emergency department. The elevation seen on your outpatient labs was the result of what is called hemolysis, which results in an artificially elevated number.  Your lab work was normal in the emergency department as well as your EKG and there is no indication for any additional testing at this time.

## 2016-12-11 NOTE — ED Notes (Signed)
Pt sent in for abnormal labs from yesterday. Per report k+ was 6.7. Normal today. Pt states no pain. vss.

## 2016-12-11 NOTE — Telephone Encounter (Signed)
Critical lab results. Potasium level was 6.7. Specimen was received hemolyzed.  Please Advise

## 2016-12-12 ENCOUNTER — Encounter: Payer: Self-pay | Admitting: Unknown Physician Specialty

## 2016-12-12 ENCOUNTER — Ambulatory Visit (INDEPENDENT_AMBULATORY_CARE_PROVIDER_SITE_OTHER): Payer: Medicare Other | Admitting: Unknown Physician Specialty

## 2016-12-12 VITALS — BP 124/85 | HR 91 | Temp 98.5°F | Wt 272.8 lb

## 2016-12-12 DIAGNOSIS — H6122 Impacted cerumen, left ear: Secondary | ICD-10-CM

## 2016-12-12 DIAGNOSIS — E875 Hyperkalemia: Secondary | ICD-10-CM

## 2016-12-12 NOTE — Progress Notes (Addendum)
BP 124/85   Pulse 91   Temp 98.5 F (36.9 C)   Wt 272 lb 12.8 oz (123.7 kg)   LMP  (LMP Unknown)   SpO2 98%   BMI 46.83 kg/m    Subjective:    Patient ID: Hannah Edwards, female    DOB: 12/23/1950, 66 y.o.   MRN: [id number]  HPI: Hannah Edwards is a 66 y.o. female  Chief Complaint  Patient presents with  . Cerumen Impaction    2 day f/up    Hyperkalemia Blood draw in the ER showing normal potassium.    Cerumen impaction Pretreated ears.  Ready to have them irrigated.    Relevant past medical, surgical, family and social history reviewed and updated as indicated. Interim medical history since our last visit reviewed. Allergies and medications reviewed and updated.  Review of Systems  Per HPI unless specifically indicated above     Objective:    BP 124/85   Pulse 91   Temp 98.5 F (36.9 C)   Wt 272 lb 12.8 oz (123.7 kg)   LMP  (LMP Unknown)   SpO2 98%   BMI 46.83 kg/m   Wt Readings from Last 3 Encounters:  12/19/16 276 lb 6.4 oz (125.4 kg)  12/12/16 272 lb 12.8 oz (123.7 kg)  12/11/16 275 lb (124.7 kg)    Physical Exam  Constitutional: She is oriented to person, place, and time. She appears well-developed and well-nourished. No distress.  HENT:  Head: Normocephalic and atraumatic.  Successfully irrigated left ear  Unable to irrigate right  Eyes: Conjunctivae and lids are normal. Right eye exhibits no discharge. Left eye exhibits no discharge. No scleral icterus.  Cardiovascular: Normal rate.   Pulmonary/Chest: Effort normal.  Abdominal: Normal appearance. There is no splenomegaly or hepatomegaly.  Musculoskeletal: Normal range of motion.  Neurological: She is alert and oriented to person, place, and time.  Skin: Skin is intact. No rash noted. No pallor.  Psychiatric: She has a normal mood and affect. Her behavior is normal. Judgment and thought content normal.    Results for orders placed or performed during the hospital encounter of 12/11/16  Basic  metabolic panel  Result Value Ref Range   Sodium 137 135 - 145 mmol/L   Potassium 4.1 3.5 - 5.1 mmol/L   Chloride 102 101 - 111 mmol/L   CO2 27 22 - 32 mmol/L   Glucose, Bld 128 (H) 65 - 99 mg/dL   BUN 16 6 - 20 mg/dL   Creatinine, Ser 4.091.00 0.44 - 1.00 mg/dL   Calcium 9.3 8.9 - 81.110.3 mg/dL   GFR calc non Af Amer 57 (L) >60 mL/min   GFR calc Af Amer >60 >60 mL/min   Anion gap 8 5 - 15  CBC  Result Value Ref Range   WBC 7.4 3.6 - 11.0 K/uL   RBC 4.50 3.80 - 5.20 MIL/uL   Hemoglobin 13.3 12.0 - 16.0 g/dL   HCT 91.440.2 78.235.0 - 95.647.0 %   MCV 89.3 80.0 - 100.0 fL   MCH 29.6 26.0 - 34.0 pg   MCHC 33.2 32.0 - 36.0 g/dL   RDW 21.314.8 (H) 08.611.5 - 57.814.5 %   Platelets 383 150 - 440 K/uL  Troponin I  Result Value Ref Range   Troponin I <0.03 <0.03 ng/mL      Assessment & Plan:   Problem List Items Addressed This Visit      Unprioritized   Ceruminosis, left    Discussed OTC products for  right ear       Other Visit Diagnoses    Hyperkalemia    -  Primary   resolved      Unable to irrigate right ear but able to irrigate left.  If needs right ear worked on again, will not charge for an additional visit.    Follow up plan: Return if symptoms worsen or fail to improve.

## 2016-12-12 NOTE — Patient Instructions (Signed)
Debrox -   May use cotton ball soaked in mineral oil into ear 10-20 min per week if having recurrent ear wax accumulation.

## 2016-12-12 NOTE — Assessment & Plan Note (Signed)
Discussed OTC products for right ear

## 2016-12-19 ENCOUNTER — Ambulatory Visit (INDEPENDENT_AMBULATORY_CARE_PROVIDER_SITE_OTHER): Payer: Medicare Other | Admitting: Unknown Physician Specialty

## 2016-12-19 ENCOUNTER — Encounter: Payer: Self-pay | Admitting: Unknown Physician Specialty

## 2016-12-19 VITALS — BP 118/78 | HR 89 | Temp 98.2°F | Wt 276.4 lb

## 2016-12-19 DIAGNOSIS — H6121 Impacted cerumen, right ear: Secondary | ICD-10-CM

## 2016-12-19 NOTE — Progress Notes (Signed)
BP 118/78   Pulse 89   Temp 98.2 F (36.8 C)   Wt 276 lb 6.4 oz (125.4 kg)   LMP  (LMP Unknown)   SpO2 98%   BMI 47.44 kg/m    Subjective:    Patient ID: Hannah Edwards, female    DOB: [birth year], 66 y.o.   MRN: [id number]  HPI: Hannah Edwards is a 66 y.o. female  Chief Complaint  Patient presents with  . Cerumen Impaction    right ear, patient states she has been using debrox     Relevant past medical, surgical, family and social history reviewed and updated as indicated. Interim medical history since our last visit reviewed. Allergies and medications reviewed and updated.  Review of Systems  Per HPI unless specifically indicated above     Objective:    BP 118/78   Pulse 89   Temp 98.2 F (36.8 C)   Wt 276 lb 6.4 oz (125.4 kg)   LMP  (LMP Unknown)   SpO2 98%   BMI 47.44 kg/m   Wt Readings from Last 3 Encounters:  12/19/16 276 lb 6.4 oz (125.4 kg)  12/12/16 272 lb 12.8 oz (123.7 kg)  12/11/16 275 lb (124.7 kg)    Physical Exam  Constitutional: She is oriented to person, place, and time. She appears well-developed and well-nourished. No distress.  HENT:  Head: Normocephalic and atraumatic.  Irrigated right ear and removed all wax  Eyes: Conjunctivae and lids are normal. Right eye exhibits no discharge. Left eye exhibits no discharge. No scleral icterus.  Cardiovascular: Normal rate.   Pulmonary/Chest: Effort normal.  Abdominal: Normal appearance. There is no splenomegaly or hepatomegaly.  Musculoskeletal: Normal range of motion.  Neurological: She is alert and oriented to person, place, and time.  Skin: Skin is intact. No rash noted. No pallor.  Psychiatric: She has a normal mood and affect. Her behavior is normal. Judgment and thought content normal.    Results for orders placed or performed during the hospital encounter of 12/11/16  Basic metabolic panel  Result Value Ref Range   Sodium 137 135 - 145 mmol/L   Potassium 4.1 3.5 - 5.1 mmol/L   Chloride  102 101 - 111 mmol/L   CO2 27 22 - 32 mmol/L   Glucose, Bld 128 (H) 65 - 99 mg/dL   BUN 16 6 - 20 mg/dL   Creatinine, Ser 7.82 0.44 - 1.00 mg/dL   Calcium 9.3 8.9 - 42.3 mg/dL   GFR calc non Af Amer 57 (L) >60 mL/min   GFR calc Af Amer >60 >60 mL/min   Anion gap 8 5 - 15  CBC  Result Value Ref Range   WBC 7.4 3.6 - 11.0 K/uL   RBC 4.50 3.80 - 5.20 MIL/uL   Hemoglobin 13.3 12.0 - 16.0 g/dL   HCT 53.6 14.4 - 31.5 %   MCV 89.3 80.0 - 100.0 fL   MCH 29.6 26.0 - 34.0 pg   MCHC 33.2 32.0 - 36.0 g/dL   RDW 40.0 (H) 86.7 - 61.9 %   Platelets 383 150 - 440 K/uL  Troponin I  Result Value Ref Range   Troponin I <0.03 <0.03 ng/mL      Assessment & Plan:   Problem List Items Addressed This Visit    None    Visit Diagnoses    Impacted cerumen of right ear    -  Primary       Follow up plan: Return if symptoms worsen or  fail to improve.

## 2016-12-23 NOTE — Addendum Note (Signed)
Addended by: Gabriel Cirri on: 12/23/2016 08:17 AM   Modules accepted: Level of Service

## 2017-01-22 ENCOUNTER — Ambulatory Visit
Admission: RE | Admit: 2017-01-22 | Discharge: 2017-01-22 | Disposition: A | Discharge status: Home / Self Care | Payer: Medicare Other | Source: Ambulatory Visit | Attending: Unknown Physician Specialty | Admitting: Unknown Physician Specialty

## 2017-01-22 DIAGNOSIS — R928 Other abnormal and inconclusive findings on diagnostic imaging of breast: Secondary | ICD-10-CM | POA: Insufficient documentation

## 2017-01-22 DIAGNOSIS — N6310 Unspecified lump in the right breast, unspecified quadrant: Secondary | ICD-10-CM | POA: Diagnosis not present

## 2017-01-22 DIAGNOSIS — E2839 Other primary ovarian failure: Secondary | ICD-10-CM | POA: Insufficient documentation

## 2017-01-22 DIAGNOSIS — Z1231 Encounter for screening mammogram for malignant neoplasm of breast: Secondary | ICD-10-CM | POA: Insufficient documentation

## 2017-01-22 DIAGNOSIS — Z780 Asymptomatic menopausal state: Secondary | ICD-10-CM | POA: Diagnosis not present

## 2017-01-22 DIAGNOSIS — Z13820 Encounter for screening for osteoporosis: Secondary | ICD-10-CM | POA: Diagnosis not present

## 2017-01-23 ENCOUNTER — Encounter: Payer: Self-pay | Admitting: Unknown Physician Specialty

## 2017-01-28 ENCOUNTER — Other Ambulatory Visit: Payer: Self-pay | Admitting: Unknown Physician Specialty

## 2017-01-28 DIAGNOSIS — R928 Other abnormal and inconclusive findings on diagnostic imaging of breast: Secondary | ICD-10-CM

## 2017-01-28 DIAGNOSIS — N6310 Unspecified lump in the right breast, unspecified quadrant: Secondary | ICD-10-CM

## 2017-02-02 ENCOUNTER — Ambulatory Visit
Admission: RE | Admit: 2017-02-02 | Discharge: 2017-02-02 | Disposition: A | Discharge status: Home / Self Care | Payer: Medicare Other | Source: Ambulatory Visit | Attending: Unknown Physician Specialty | Admitting: Unknown Physician Specialty

## 2017-02-02 DIAGNOSIS — N6310 Unspecified lump in the right breast, unspecified quadrant: Secondary | ICD-10-CM

## 2017-02-02 DIAGNOSIS — N6489 Other specified disorders of breast: Secondary | ICD-10-CM | POA: Diagnosis not present

## 2017-02-02 DIAGNOSIS — R928 Other abnormal and inconclusive findings on diagnostic imaging of breast: Secondary | ICD-10-CM | POA: Diagnosis not present

## 2017-02-27 DIAGNOSIS — Z1212 Encounter for screening for malignant neoplasm of rectum: Secondary | ICD-10-CM | POA: Diagnosis not present

## 2017-02-27 DIAGNOSIS — Z1211 Encounter for screening for malignant neoplasm of colon: Secondary | ICD-10-CM | POA: Diagnosis not present

## 2017-02-27 LAB — COLOGUARD: COLOGUARD: NEGATIVE

## 2017-12-11 ENCOUNTER — Ambulatory Visit: Payer: Medicare Other

## 2018-01-13 ENCOUNTER — Ambulatory Visit (INDEPENDENT_AMBULATORY_CARE_PROVIDER_SITE_OTHER): Payer: Medicare Other

## 2018-01-13 VITALS — BP 142/72 | HR 73 | Temp 98.2°F | Resp 16 | Ht 65.0 in | Wt 272.7 lb

## 2018-01-13 DIAGNOSIS — Z23 Encounter for immunization: Secondary | ICD-10-CM | POA: Diagnosis not present

## 2018-01-13 DIAGNOSIS — Z0000 Encounter for general adult medical examination without abnormal findings: Secondary | ICD-10-CM

## 2018-01-13 NOTE — Progress Notes (Signed)
Subjective:   Hannah Edwards is a 67 y.o. female who presents for Medicare Annual (Subsequent) preventive examination.  Review of Systems:  Cardiac Risk Factors include: advanced age (>42men, >63 women);hypertension;obesity (BMI >30kg/m2)     Objective:     Vitals: BP (!) 142/72 (BP Location: Left Arm, Patient Position: Sitting)   Pulse 73   Temp 98.2 F (36.8 C) (Temporal)   Resp 16   Ht 5\' 5"  (1.651 m)   Wt 272 lb 11.2 oz (123.7 kg)   LMP  (LMP Unknown)   BMI 45.38 kg/m   Body mass index is 45.38 kg/m.  Advanced Directives 01/13/2018 12/10/2016 05/11/2015 01/03/2015  Does Patient Have a Medical Advance Directive? No No No No  Would patient like information on creating a medical advance directive? Yes (MAU/Ambulatory/Procedural Areas - Information given) Yes (ED - Information included in AVS) - No - patient declined information    Tobacco Social History   Tobacco Use  Smoking Status Former Smoker  . Types: Cigarettes  . Last attempt to quit: 01/05/1975  . Years since quitting: 43.0  Smokeless Tobacco Never Used     Counseling given: Not Answered   Clinical Intake:  Pre-visit preparation completed: Yes  Pain : No/denies pain     Nutritional Status: BMI > 30  Obese Nutritional Risks: None Diabetes: No  How often do you need to have someone help you when you read instructions, pamphlets, or other written materials from your doctor or pharmacy?: 1 - Never What is the last grade level you completed in school?: bachelors degree  Interpreter Needed?: No  Information entered by :: Tiffany Hill,LPN   History reviewed. No pertinent past medical history. Past Surgical History:  Procedure Laterality Date  . ABDOMINAL HYSTERECTOMY    . WRIST SURGERY Left    Family History  Problem Relation Age of Onset  . Hypertension Mother   . Cancer Mother        unknown type   . Hypertension Father   . Aneurysm Father   . Stomach cancer Sister   . Diabetes Brother   .  HIV/AIDS Brother   . Breast cancer Neg Hx   . Lung cancer Neg Hx    Social History   Socioeconomic History  . Marital status: Widowed    Spouse name: Not on file  . Number of children: Not on file  . Years of education: Not on file  . Highest education level: Bachelor's degree (e.g., BA, AB, BS)  Occupational History  . Not on file  Social Needs  . Financial resource strain: Not hard at all  . Food insecurity:    Worry: Never true    Inability: Never true  . Transportation needs:    Medical: No    Non-medical: No  Tobacco Use  . Smoking status: Former Smoker    Types: Cigarettes    Last attempt to quit: 01/05/1975    Years since quitting: 43.0  . Smokeless tobacco: Never Used  Substance and Sexual Activity  . Alcohol use: No    Alcohol/week: 0.0 standard drinks  . Drug use: No  . Sexual activity: Never  Lifestyle  . Physical activity:    Days per week: 0 days    Minutes per session: 0 min  . Stress: Not at all  Relationships  . Social connections:    Talks on phone: More than three times a week    Gets together: More than three times a week    Attends  religious service: More than 4 times per year    Active member of club or organization: No    Attends meetings of clubs or organizations: Never    Relationship status: Widowed  Other Topics Concern  . Not on file  Social History Narrative   Working full time     Outpatient Encounter Medications as of 01/13/2018  Medication Sig  . albuterol (PROVENTIL HFA;VENTOLIN HFA) 108 (90 Base) MCG/ACT inhaler Inhale 2 puffs into the lungs every 6 (six) hours as needed for wheezing or shortness of breath.  . carvedilol (COREG) 12.5 MG tablet Take 1 tablet (12.5 mg total) by mouth 2 (two) times daily with a meal.  . omeprazole (PRILOSEC) 20 MG capsule Take 1 capsule (20 mg total) by mouth daily.   No facility-administered encounter medications on file as of 01/13/2018.     Activities of Daily Living In your present state of  health, do you have any difficulty performing the following activities: 01/13/2018  Hearing? N  Vision? N  Difficulty concentrating or making decisions? N  Walking or climbing stairs? N  Dressing or bathing? N  Doing errands, shopping? N  Preparing Food and eating ? N  Using the Toilet? N  In the past six months, have you accidently leaked urine? N  Do you have problems with loss of bowel control? N  Managing your Medications? N  Managing your Finances? N  Housekeeping or managing your Housekeeping? N  Some recent data might be hidden    Patient Care Team: Gabriel Cirri, NP as PCP - General (Nurse Practitioner)    Assessment:   This is a routine wellness examination for Hannah Edwards.  Exercise Activities and Dietary recommendations Current Exercise Habits: The patient does not participate in regular exercise at present, Exercise limited by: None identified  Goals    . DIET - INCREASE WATER INTAKE     Recommend drinking at least 6-8 glasses of water a day        Fall Risk Fall Risk  01/13/2018 12/10/2016 05/11/2015  Falls in the past year? No No Yes  Number falls in past yr: - - 1  Injury with Fall? - - No   Is the patient's home free of loose throw rugs in walkways, pet beds, electrical cords, etc?   yes      Grab bars in the bathroom? no      Handrails on the stairs?   yes      Adequate lighting?   yes  Timed Get Up and Go performed: Completed in 8 seconds with no use of assistive devices, steady gait. No intervention needed at this time.   Depression Screen PHQ 2/9 Scores 01/13/2018 12/10/2016 05/11/2015  PHQ - 2 Score 0 0 0  PHQ- 9 Score - 0 -     Cognitive Function     6CIT Screen 01/13/2018  What Year? 0 points  What month? 0 points  What time? 0 points  Count back from 20 0 points  Months in reverse 0 points  Repeat phrase 0 points  Total Score 0    Immunization History  Administered Date(s) Administered  . Influenza, High Dose Seasonal PF 01/13/2018  .  Influenza-Unspecified 02/12/2015  . PPD Test 09/09/2013  . Pneumococcal Conjugate-13 12/10/2016  . Pneumococcal Polysaccharide-23 01/13/2018  . Zoster 06/17/2010    Qualifies for Shingles Vaccine? Yes, discussed shingrix vaccine   Screening Tests Health Maintenance  Topic Date Due  . TETANUS/TDAP  05/22/1968  . MAMMOGRAM  01/23/2019  .  Fecal DNA (Cologuard)  02/28/2020  . INFLUENZA VACCINE  Completed  . DEXA SCAN  Completed  . Hepatitis C Screening  Completed  . PNA vac Low Risk Adult  Completed    Cancer Screenings: Lung: Low Dose CT Chest recommended if Age 20-80 years, 30 pack-year currently smoking OR have quit w/in 15years. Patient does not qualify. Breast:  Up to date on Mammogram? Yes  01/22/2017 Up to date of Bone Density/Dexa? Yes 01/22/2017 Colorectal: cologuard completed 02/27/2017  Additional Screenings:  Hepatitis C Screening: not indicated      Plan:    I have personally reviewed and addressed the Medicare Annual Wellness questionnaire and have noted the following in the patient's chart:  A. Medical and social history B. Use of alcohol, tobacco or illicit drugs  C. Current medications and supplements D. Functional ability and status E.  Nutritional status F.  Physical activity G. Advance directives H. List of other physicians I.  Hospitalizations, surgeries, and ER visits in previous 12 months J.  Vitals K. Screenings such as hearing and vision if needed, cognitive and depression L. Referrals and appointments   In addition, I have reviewed and discussed with patient certain preventive protocols, quality metrics, and best practice recommendations. A written personalized care plan for preventive services as well as general preventive health recommendations were provided to patient.   Signed,  Marin Roberts, LPN Nurse Health Advisor   Nurse Notes:none

## 2018-01-13 NOTE — Patient Instructions (Addendum)
[name] , Thank you for taking time to come for your Medicare Wellness Visit. I appreciate your ongoing commitment to your health goals. Please review the following plan we discussed and let me know if I can assist you in the future.   Screening recommendations/referrals: Colonoscopy: cologuard completed 02/27/2017 Mammogram: completed 01/22/2017 Bone Density: completed 01/22/2017 Recommended yearly ophthalmology/optometry visit for glaucoma screening and checkup Recommended yearly dental visit for hygiene and checkup  Vaccinations: Influenza vaccine: due now- done today Pneumococcal vaccine: pneumovax 23 due now- done today  Tdap vaccine: due, check with your insurance company for coverage Shingles vaccine: shingrix eligible, check with your insurance company for coverage     Advanced directives: Advance directive discussed with you today. I have provided a copy for you to complete at home and have notarized. Once this is complete please bring a copy in to our office so we can scan it into your chart.  Conditions/risks identified: Recommend drinking at least 6-8 glasses of water a day   Next appointment: Follow up in one year for your annual wellness exam.    Preventive Care 65 Years and Older, Female Preventive care refers to lifestyle choices and visits with your health care provider that can promote health and wellness. What does preventive care include?  A yearly physical exam. This is also called an annual well check.  Dental exams once or twice a year.  Routine eye exams. Ask your health care provider how often you should have your eyes checked.  Personal lifestyle choices, including:  Daily care of your teeth and gums.  Regular physical activity.  Eating a healthy diet.  Avoiding tobacco and drug use.  Limiting alcohol use.  Practicing safe sex.  Taking low-dose aspirin every day.  Taking vitamin and mineral supplements as recommended by your health care  provider. What happens during an annual well check? The services and screenings done by your health care provider during your annual well check will depend on your age, overall health, lifestyle risk factors, and family history of disease. Counseling  Your health care provider may ask you questions about your:  Alcohol use.  Tobacco use.  Drug use.  Emotional well-being.  Home and relationship well-being.  Sexual activity.  Eating habits.  History of falls.  Memory and ability to understand (cognition).  Work and work Astronomer.  Reproductive health. Screening  You may have the following tests or measurements:  Height, weight, and BMI.  Blood pressure.  Lipid and cholesterol levels. These may be checked every 5 years, or more frequently if you are over 76 years old.  Skin check.  Lung cancer screening. You may have this screening every year starting at age 70 if you have a 30-pack-year history of smoking and currently smoke or have quit within the past 15 years.  Fecal occult blood test (FOBT) of the stool. You may have this test every year starting at age 64.  Flexible sigmoidoscopy or colonoscopy. You may have a sigmoidoscopy every 5 years or a colonoscopy every 10 years starting at age 37.  Hepatitis C blood test.  Hepatitis B blood test.  Sexually transmitted disease (STD) testing.  Diabetes screening. This is done by checking your blood sugar (glucose) after you have not eaten for a while (fasting). You may have this done every 1-3 years.  Bone density scan. This is done to screen for osteoporosis. You may have this done starting at age 50.  Mammogram. This may be done every 1-2 years. Talk  to your health care provider about how often you should have regular mammograms. Talk with your health care provider about your test results, treatment options, and if necessary, the need for more tests. Vaccines  Your health care provider may recommend certain  vaccines, such as:  Influenza vaccine. This is recommended every year.  Tetanus, diphtheria, and acellular pertussis (Tdap, Td) vaccine. You may need a Td booster every 10 years.  Zoster vaccine. You may need this after age 66.  Pneumococcal 13-valent conjugate (PCV13) vaccine. One dose is recommended after age 71.  Pneumococcal polysaccharide (PPSV23) vaccine. One dose is recommended after age 56. Talk to your health care provider about which screenings and vaccines you need and how often you need them. This information is not intended to replace advice given to you by your health care provider. Make sure you discuss any questions you have with your health care provider. Document Released: 05/18/2015 Document Revised: 01/09/2016 Document Reviewed: 02/20/2015 Elsevier Interactive Patient Education  2017 [place] Prevention in the Home Falls can cause injuries. They can happen to people of all ages. There are many things you can do to make your home safe and to help prevent falls. What can I do on the outside of my home?  Regularly fix the edges of walkways and driveways and fix any cracks.  Remove anything that might make you trip as you walk through a door, such as a raised step or threshold.  Trim any bushes or trees on the path to your home.  Use bright outdoor lighting.  Clear any walking paths of anything that might make someone trip, such as rocks or tools.  Regularly check to see if handrails are loose or broken. Make sure that both sides of any steps have handrails.  Any raised decks and porches should have guardrails on the edges.  Have any leaves, snow, or ice cleared regularly.  Use sand or salt on walking paths during winter.  Clean up any spills in your garage right away. This includes oil or grease spills. What can I do in the bathroom?  Use night lights.  Install grab bars by the toilet and in the tub and shower. Do not use towel bars as grab  bars.  Use non-skid mats or decals in the tub or shower.  If you need to sit down in the shower, use a plastic, non-slip stool.  Keep the floor dry. Clean up any water that spills on the floor as soon as it happens.  Remove soap buildup in the tub or shower regularly.  Attach bath mats securely with double-sided non-slip rug tape.  Do not have throw rugs and other things on the floor that can make you trip. What can I do in the bedroom?  Use night lights.  Make sure that you have a light by your bed that is easy to reach.  Do not use any sheets or blankets that are too big for your bed. They should not hang down onto the floor.  Have a firm chair that has side arms. You can use this for support while you get dressed.  Do not have throw rugs and other things on the floor that can make you trip. What can I do in the kitchen?  Clean up any spills right away.  Avoid walking on wet floors.  Keep items that you use a lot in easy-to-reach places.  If you need to reach something above you, use a strong step stool that  has a grab bar.  Keep electrical cords out of the way.  Do not use floor polish or wax that makes floors slippery. If you must use wax, use non-skid floor wax.  Do not have throw rugs and other things on the floor that can make you trip. What can I do with my stairs?  Do not leave any items on the stairs.  Make sure that there are handrails on both sides of the stairs and use them. Fix handrails that are broken or loose. Make sure that handrails are as long as the stairways.  Check any carpeting to make sure that it is firmly attached to the stairs. Fix any carpet that is loose or worn.  Avoid having throw rugs at the top or bottom of the stairs. If you do have throw rugs, attach them to the floor with carpet tape.  Make sure that you have a light switch at the top of the stairs and the bottom of the stairs. If you do not have them, ask someone to add them for  you. What else can I do to help prevent falls?  Wear shoes that:  Do not have high heels.  Have rubber bottoms.  Are comfortable and fit you well.  Are closed at the toe. Do not wear sandals.  If you use a stepladder:  Make sure that it is fully opened. Do not climb a closed stepladder.  Make sure that both sides of the stepladder are locked into place.  Ask someone to hold it for you, if possible.  Clearly mark and make sure that you can see:  Any grab bars or handrails.  First and last steps.  Where the edge of each step is.  Use tools that help you move around (mobility aids) if they are needed. These include:  Canes.  Walkers.  Scooters.  Crutches.  Turn on the lights when you go into a dark area. Replace any light bulbs as soon as they burn out.  Set up your furniture so you have a clear path. Avoid moving your furniture around.  If any of your floors are uneven, fix them.  If there are any pets around you, be aware of where they are.  Review your medicines with your doctor. Some medicines can make you feel dizzy. This can increase your chance of falling. Ask your doctor what other things that you can do to help prevent falls. This information is not intended to replace advice given to you by your health care provider. Make sure you discuss any questions you have with your health care provider. Document Released: 02/15/2009 Document Revised: 09/27/2015 Document Reviewed: 05/26/2014 Elsevier Interactive Patient Education  2017 [employer]  Influenza (Flu) Vaccine (Inactivated or Recombinant): What You Need to Know 1. Why get vaccinated? Influenza ("flu") is a contagious disease that spreads around the [place] every year, usually between October and May. Flu is caused by influenza viruses, and is spread mainly by coughing, sneezing, and close contact. Anyone can get flu. Flu strikes suddenly and can last several days. Symptoms vary by age, but can  include:  fever/chills  sore throat  muscle aches  fatigue  cough  headache  runny or stuffy nose  Flu can also lead to pneumonia and blood infections, and cause diarrhea and seizures in children. If you have a medical condition, such as heart or lung disease, flu can make it worse. Flu is more dangerous for some people. Infants and young children, people 55  years of age and older, pregnant women, and people with certain health conditions or a weakened immune system are at greatest risk. Each year thousands of people in the [place] die from flu, and many more are hospitalized. Flu vaccine can:  keep you from getting flu,  make flu less severe if you do get it, and  keep you from spreading flu to your family and other people. 2. Inactivated and recombinant flu vaccines A dose of flu vaccine is recommended every flu season. Children 6 months through 97 years of age may need two doses during the same flu season. Everyone else needs only one dose each flu season. Some inactivated flu vaccines contain a very small amount of a mercury-based preservative called thimerosal. Studies have not shown thimerosal in vaccines to be harmful, but flu vaccines that do not contain thimerosal are available. There is no live flu virus in flu shots. They cannot cause the flu. There are many flu viruses, and they are always changing. Each year a new flu vaccine is made to protect against three or four viruses that are likely to cause disease in the upcoming flu season. But even when the vaccine doesn't exactly match these viruses, it may still provide some protection. Flu vaccine cannot prevent:  flu that is caused by a virus not covered by the vaccine, or  illnesses that look like flu but are not.  It takes about 2 weeks for protection to develop after vaccination, and protection lasts through the flu season. 3. Some people should not get this vaccine Tell the person who is giving you the  vaccine:  If you have any severe, life-threatening allergies. If you ever had a life-threatening allergic reaction after a dose of flu vaccine, or have a severe allergy to any part of this vaccine, you may be advised not to get vaccinated. Most, but not all, types of flu vaccine contain a small amount of egg protein.  If you ever had Guillain-Barr Syndrome (also called GBS). Some people with a history of GBS should not get this vaccine. This should be discussed with your doctor.  If you are not feeling well. It is usually okay to get flu vaccine when you have a mild illness, but you might be asked to come back when you feel better.  4. Risks of a vaccine reaction With any medicine, including vaccines, there is a chance of reactions. These are usually mild and go away on their own, but serious reactions are also possible. Most people who get a flu shot do not have any problems with it. Minor problems following a flu shot include:  soreness, redness, or swelling where the shot was given  hoarseness  sore, red or itchy eyes  cough  fever  aches  headache  itching  fatigue  If these problems occur, they usually begin soon after the shot and last 1 or 2 days. More serious problems following a flu shot can include the following:  There may be a small increased risk of Guillain-Barre Syndrome (GBS) after inactivated flu vaccine. This risk has been estimated at 1 or 2 additional cases per million people vaccinated. This is much lower than the risk of severe complications from flu, which can be prevented by flu vaccine.  Young children who get the flu shot along with pneumococcal vaccine (PCV13) and/or DTaP vaccine at the same time might be slightly more likely to have a seizure caused by fever. Ask your doctor for more information. Tell your  doctor if a child who is getting flu vaccine has ever had a seizure.  Problems that could happen after any injected vaccine:  People sometimes  faint after a medical procedure, including vaccination. Sitting or lying down for about 15 minutes can help prevent fainting, and injuries caused by a fall. Tell your doctor if you feel dizzy, or have vision changes or ringing in the ears.  Some people get severe pain in the shoulder and have difficulty moving the arm where a shot was given. This happens very rarely.  Any medication can cause a severe allergic reaction. Such reactions from a vaccine are very rare, estimated at about 1 in a million doses, and would happen within a few minutes to a few hours after the vaccination. As with any medicine, there is a very remote chance of a vaccine causing a serious injury or death. The safety of vaccines is always being monitored. For more information, visit: http://floyd.org/ 5. What if there is a serious reaction? What should I look for? Look for anything that concerns you, such as signs of a severe allergic reaction, very high fever, or unusual behavior. Signs of a severe allergic reaction can include hives, swelling of the face and throat, difficulty breathing, a fast heartbeat, dizziness, and weakness. These would start a few minutes to a few hours after the vaccination. What should I do?  If you think it is a severe allergic reaction or other emergency that can't wait, call 9-1-1 and get the person to the nearest hospital. Otherwise, call your doctor.  Reactions should be reported to the Vaccine Adverse Event Reporting System (VAERS). Your doctor should file this report, or you can do it yourself through the VAERS web site at www.vaers.LAgents.no, or by calling 1-(415)860-3524. ? VAERS does not give medical advice. 6. The National Vaccine Injury Compensation Program The [employer] Vaccine Injury Compensation Program (VICP) is a federal program that was created to compensate people who may have been injured by certain vaccines. Persons who believe they may have been injured by a vaccine can  learn about the program and about filing a claim by calling 1-808 230 0620 or visiting the VICP website at SpiritualWord.at. There is a time limit to file a claim for compensation. 7. How can I learn more?  Ask your healthcare provider. He or she can give you the vaccine package insert or suggest other sources of information.  Call your local or state health department.  Contact the Centers for Disease Control and Prevention (CDC): ? Call 573-519-8524 (1-800-CDC-INFO) or ? Visit CDC's website at BiotechRoom.com.cy Vaccine Information Statement, Inactivated Influenza Vaccine (12/09/2013) This information is not intended to replace advice given to you by your health care provider. Make sure you discuss any questions you have with your health care provider. Document Released: 02/13/2006 Document Revised: 01/10/2016 Document Reviewed: 01/10/2016 Elsevier Interactive Patient Education  2017 Elsevier Inc.   Pneumococcal Polysaccharide Vaccine: What You Need to Know 1. Why get vaccinated? Vaccination can protect older adults (and some children and younger adults) from pneumococcal disease. Pneumococcal disease is caused by bacteria that can spread from person to person through close contact. It can cause ear infections, and it can also lead to more serious infections of the:  Lungs (pneumonia),  Blood (bacteremia), and  Covering of the brain and spinal cord (meningitis). Meningitis can cause deafness and brain damage, and it can be fatal.  Anyone can get pneumococcal disease, but children under 43 years of age, people with certain medical conditions, adults  over 50 years of age, and cigarette smokers are at the highest risk. About 18,000 older adults die each year from pneumococcal disease in the [place]. Treatment of pneumococcal infections with penicillin and other drugs used to be more effective. But some strains of the disease have become resistant to these drugs. This  makes prevention of the disease, through vaccination, even more important. 2. Pneumococcal polysaccharide vaccine (PPSV23) Pneumococcal polysaccharide vaccine (PPSV23) protects against 23 types of pneumococcal bacteria. It will not prevent all pneumococcal disease. PPSV23 is recommended for:  All adults 65 years of age and older,  Anyone 2 through 68 years of age with certain long-term health problems,  Anyone 2 through 68 years of age with a weakened immune system,  Adults 63 through 68 years of age who smoke cigarettes or have asthma.  Most people need only one dose of PPSV. A second dose is recommended for certain high-risk groups. People 61 and older should get a dose even if they have gotten one or more doses of the vaccine before they turned 65. Your healthcare provider can give you more information about these recommendations. Most healthy adults develop protection within 2 to 3 weeks of getting the shot. 3. Some people should not get this vaccine  Anyone who has had a life-threatening allergic reaction to PPSV should not get another dose.  Anyone who has a severe allergy to any component of PPSV should not receive it. Tell your provider if you have any severe allergies.  Anyone who is moderately or severely ill when the shot is scheduled may be asked to wait until they recover before getting the vaccine. Someone with a mild illness can usually be vaccinated.  Children less than 6 years of age should not receive this vaccine.  There is no evidence that PPSV is harmful to either a pregnant woman or to her fetus. However, as a precaution, women who need the vaccine should be vaccinated before becoming pregnant, if possible. 4. Risks of a vaccine reaction With any medicine, including vaccines, there is a chance of side effects. These are usually mild and go away on their own, but serious reactions are also possible. About half of people who get PPSV have mild side effects, such as  redness or pain where the shot is given, which go away within about two days. Less than 1 out of 100 people develop a fever, muscle aches, or more severe local reactions. Problems that could happen after any vaccine:  People sometimes faint after a medical procedure, including vaccination. Sitting or lying down for about 15 minutes can help prevent fainting, and injuries caused by a fall. Tell your doctor if you feel dizzy, or have vision changes or ringing in the ears.  Some people get severe pain in the shoulder and have difficulty moving the arm where a shot was given. This happens very rarely.  Any medication can cause a severe allergic reaction. Such reactions from a vaccine are very rare, estimated at about 1 in a million doses, and would happen within a few minutes to a few hours after the vaccination. As with any medicine, there is a very remote chance of a vaccine causing a serious injury or death. The safety of vaccines is always being monitored. For more information, visit: http://floyd.org/ 5. What if there is a serious reaction? What should I look for? Look for anything that concerns you, such as signs of a severe allergic reaction, very high fever, or unusual behavior. Signs of  a severe allergic reaction can include hives, swelling of the face and throat, difficulty breathing, a fast heartbeat, dizziness, and weakness. These would usually start a few minutes to a few hours after the vaccination. What should I do? If you think it is a severe allergic reaction or other emergency that can't wait, call 9-1-1 or get to the nearest hospital. Otherwise, call your doctor. Afterward, the reaction should be reported to the Vaccine Adverse Event Reporting System (VAERS). Your doctor might file this report, or you can do it yourself through the VAERS web site at www.vaers.LAgents.no, or by calling 1-229-808-2770. VAERS does not give medical advice. 6. How can I learn more?  Ask your  doctor. He or she can give you the vaccine package insert or suggest other sources of information.  Call your local or state health department.  Contact the Centers for Disease Control and Prevention (CDC): ? Call (813) 544-5652 (1-800-CDC-INFO) or ? Visit CDC's website at PicCapture.uy CDC Pneumococcal Polysaccharide Vaccine VIS (08/26/13) This information is not intended to replace advice given to you by your health care provider. Make sure you discuss any questions you have with your health care provider. Document Released: 02/16/2006 Document Revised: 01/10/2016 Document Reviewed: 01/10/2016 Elsevier Interactive Patient Education  2017 [employer].

## 2018-05-17 ENCOUNTER — Encounter: Payer: Self-pay | Admitting: Family Medicine

## 2018-05-17 ENCOUNTER — Ambulatory Visit (INDEPENDENT_AMBULATORY_CARE_PROVIDER_SITE_OTHER): Payer: Medicare Other | Admitting: Family Medicine

## 2018-05-17 VITALS — BP 139/85 | HR 85 | Temp 98.6°F | Ht 65.0 in | Wt 282.3 lb

## 2018-05-17 DIAGNOSIS — R7301 Impaired fasting glucose: Secondary | ICD-10-CM | POA: Diagnosis not present

## 2018-05-17 DIAGNOSIS — E7800 Pure hypercholesterolemia, unspecified: Secondary | ICD-10-CM

## 2018-05-17 DIAGNOSIS — Z6841 Body Mass Index (BMI) 40.0 and over, adult: Secondary | ICD-10-CM | POA: Diagnosis not present

## 2018-05-17 DIAGNOSIS — I10 Essential (primary) hypertension: Secondary | ICD-10-CM | POA: Diagnosis not present

## 2018-05-17 MED ORDER — CARVEDILOL 12.5 MG PO TABS: 12.5000 mg | ORAL_TABLET | Freq: Two times a day (BID) | ORAL | 1 refills | Status: DC

## 2018-05-17 MED ORDER — ALBUTEROL SULFATE HFA 108 (90 BASE) MCG/ACT IN AERS: 2.0000 | INHALATION_SPRAY | Freq: Four times a day (QID) | RESPIRATORY_TRACT | 0 refills | Status: DC | PRN

## 2018-05-17 MED ORDER — OMEPRAZOLE 20 MG PO CPDR: 20.0000 mg | DELAYED_RELEASE_CAPSULE | Freq: Every day | ORAL | 3 refills | Status: DC

## 2018-05-17 NOTE — Assessment & Plan Note (Signed)
Recheck A1C, adjust as needed 

## 2018-05-17 NOTE — Assessment & Plan Note (Signed)
Stable and WNL, continue current regimen. DASH diet, exercise reviewed 

## 2018-05-17 NOTE — Assessment & Plan Note (Signed)
Recheck lipids, adjust as needed. Statin intolerance in the past

## 2018-05-17 NOTE — Assessment & Plan Note (Signed)
Discussed diet and exercise modifications.   

## 2018-05-17 NOTE — Progress Notes (Signed)
BP 139/85 (BP Location: Right Arm, Patient Position: Sitting, Cuff Size: Large)   Pulse 85   Temp 98.6 F (37 C)   Ht 5\' 5"  (1.651 m)   Wt 282 lb 5 oz (128.1 kg)   LMP  (LMP Unknown)   SpO2 97%   BMI 46.98 kg/m    Subjective:    Patient ID: Hannah Edwards, female    DOB: 19-Jul-1950, 68 y.o.   MRN: [id number]  HPI: Hannah Edwards is a 68 y.o. female  Chief Complaint  Patient presents with  . Hypertension  . Gastroesophageal Reflux   Here today for chronic condition management after being lost to follow up for 1.5 years. Checks BPs fairly often at home and getting 120s/80s. Taking carvedilol BID without issue. Denies CP, SOB, HAs, dizziness, syncope. Trying to watch what she eats, not as active as she could be.   Also has a hx of IFG with borderline A1C of 6.3. Currently diet controlled. No low blood sugar spells. Trying to lose weight and improve her lifestyle modifications.   Taking prilosec for reflux with good relief.   Hyperlipidemia currently diet controlled. Has had myalgias on lipitor in the past.   No past medical history on file.  Social History   Socioeconomic History  . Marital status: Widowed    Spouse name: Not on file  . Number of children: Not on file  . Years of education: Not on file  . Highest education level: Bachelor's degree (e.g., BA, AB, BS)  Occupational History  . Not on file  Social Needs  . Financial resource strain: Not hard at all  . Food insecurity:    Worry: Never true    Inability: Never true  . Transportation needs:    Medical: No    Non-medical: No  Tobacco Use  . Smoking status: Former Smoker    Types: Cigarettes    Last attempt to quit: 01/05/1975    Years since quitting: 43.3  . Smokeless tobacco: Never Used  Substance and Sexual Activity  . Alcohol use: No    Alcohol/week: 0.0 standard drinks  . Drug use: No  . Sexual activity: Never  Lifestyle  . Physical activity:    Days per week: 0 days    Minutes per session: 0 min    . Stress: Not at all  Relationships  . Social connections:    Talks on phone: More than three times a week    Gets together: More than three times a week    Attends religious service: More than 4 times per year    Active member of club or organization: No    Attends meetings of clubs or organizations: Never    Relationship status: Widowed  . Intimate partner violence:    Fear of current or ex partner: No    Emotionally abused: No    Physically abused: No    Forced sexual activity: No  Other Topics Concern  . Not on file  Social History Narrative   Working full time      Relevant past medical, surgical, family and social history reviewed and updated as indicated. Interim medical history since our last visit reviewed. Allergies and medications reviewed and updated.  Review of Systems  Per HPI unless specifically indicated above     Objective:    BP 139/85 (BP Location: Right Arm, Patient Position: Sitting, Cuff Size: Large)   Pulse 85   Temp 98.6 F (37 C)   Ht 5\' 5"  (1.651  m)   Wt 282 lb 5 oz (128.1 kg)   LMP  (LMP Unknown)   SpO2 97%   BMI 46.98 kg/m   Wt Readings from Last 3 Encounters:  05/17/18 282 lb 5 oz (128.1 kg)  01/13/18 272 lb 11.2 oz (123.7 kg)  12/19/16 276 lb 6.4 oz (125.4 kg)    Physical Exam Vitals signs and nursing note reviewed.  Constitutional:      Appearance: Normal appearance. She is not ill-appearing.  HENT:     Head: Atraumatic.  Eyes:     Extraocular Movements: Extraocular movements intact.     Conjunctiva/sclera: Conjunctivae normal.  Neck:     Musculoskeletal: Normal range of motion and neck supple.  Cardiovascular:     Rate and Rhythm: Normal rate and regular rhythm.     Heart sounds: Normal heart sounds.  Pulmonary:     Effort: Pulmonary effort is normal.     Breath sounds: Normal breath sounds.  Musculoskeletal: Normal range of motion.  Skin:    General: Skin is warm and dry.  Neurological:     Mental Status: She is  alert and oriented to person, place, and time.  Psychiatric:        Mood and Affect: Mood normal.        Thought Content: Thought content normal.        Judgment: Judgment normal.     Results for orders placed or performed in visit on 03/09/17  Cologuard  Result Value Ref Range   Cologuard Negative       Assessment & Plan:   Problem List Items Addressed This Visit      Cardiovascular and Mediastinum   Hypertension - Primary    Stable and WNL, continue current regimen. DASH diet, exercise reviewed      Relevant Medications   carvedilol (COREG) 12.5 MG tablet   Other Relevant Orders   Comprehensive metabolic panel     Endocrine   IFG (impaired fasting glucose)    Recheck A1C, adjust as needed      Relevant Orders   HgB A1c     Other   Morbid obesity (HCC)   Hyperlipidemia    Recheck lipids, adjust as needed. Statin intolerance in the past      Relevant Medications   carvedilol (COREG) 12.5 MG tablet   Other Relevant Orders   Lipid Panel w/o Chol/HDL Ratio   BMI 45.0-49.9, adult (HCC)    Discussed diet and exercise modifications.           Follow up plan: Return in about 6 months (around 11/15/2018) for 6 month f/u, and AWV with nurse health advisor.

## 2018-05-18 ENCOUNTER — Encounter: Payer: Self-pay | Admitting: Family Medicine

## 2018-05-18 LAB — COMPREHENSIVE METABOLIC PANEL
ALT: 13 IU/L (ref 0–32)
AST: 15 IU/L (ref 0–40)
Albumin/Globulin Ratio: 1.7 (ref 1.2–2.2)
Albumin: 4.3 g/dL (ref 3.6–4.8)
Alkaline Phosphatase: 82 IU/L (ref 39–117)
BILIRUBIN TOTAL: 0.3 mg/dL (ref 0.0–1.2)
BUN/Creatinine Ratio: 17 (ref 12–28)
BUN: 13 mg/dL (ref 8–27)
CHLORIDE: 104 mmol/L (ref 96–106)
CO2: 24 mmol/L (ref 20–29)
Calcium: 9.3 mg/dL (ref 8.7–10.3)
Creatinine, Ser: 0.75 mg/dL (ref 0.57–1.00)
GFR calc non Af Amer: 82 mL/min/{1.73_m2} (ref >59)
GFR, EST AFRICAN AMERICAN: 95 mL/min/{1.73_m2} (ref >59)
GLUCOSE: 92 mg/dL (ref 65–99)
Globulin, Total: 2.6 g/dL (ref 1.5–4.5)
Potassium: 4.6 mmol/L (ref 3.5–5.2)
Sodium: 144 mmol/L (ref 134–144)
TOTAL PROTEIN: 6.9 g/dL (ref 6.0–8.5)

## 2018-05-18 LAB — LIPID PANEL W/O CHOL/HDL RATIO
CHOLESTEROL TOTAL: 200 mg/dL — AB (ref 100–199)
HDL: 51 mg/dL (ref >39)
LDL Calculated: 133 mg/dL — ABNORMAL HIGH (ref 0–99)
TRIGLYCERIDES: 78 mg/dL (ref 0–149)
VLDL CHOLESTEROL CAL: 16 mg/dL (ref 5–40)

## 2018-05-18 LAB — HEMOGLOBIN A1C
ESTIMATED AVERAGE GLUCOSE: 128 mg/dL
Hgb A1c MFr Bld: 6.1 % — ABNORMAL HIGH (ref 4.8–5.6)

## 2018-11-16 ENCOUNTER — Other Ambulatory Visit: Payer: Self-pay

## 2018-11-16 ENCOUNTER — Ambulatory Visit (INDEPENDENT_AMBULATORY_CARE_PROVIDER_SITE_OTHER): Payer: Medicare Other | Admitting: Family Medicine

## 2018-11-16 ENCOUNTER — Encounter: Payer: Self-pay | Admitting: Family Medicine

## 2018-11-16 DIAGNOSIS — Z6841 Body Mass Index (BMI) 40.0 and over, adult: Secondary | ICD-10-CM

## 2018-11-16 DIAGNOSIS — E7800 Pure hypercholesterolemia, unspecified: Secondary | ICD-10-CM

## 2018-11-16 DIAGNOSIS — K219 Gastro-esophageal reflux disease without esophagitis: Secondary | ICD-10-CM | POA: Diagnosis not present

## 2018-11-16 DIAGNOSIS — R7301 Impaired fasting glucose: Secondary | ICD-10-CM | POA: Diagnosis not present

## 2018-11-16 DIAGNOSIS — I10 Essential (primary) hypertension: Secondary | ICD-10-CM

## 2018-11-16 MED ORDER — CARVEDILOL 12.5 MG PO TABS: 12.5000 mg | ORAL_TABLET | Freq: Two times a day (BID) | ORAL | 1 refills | Status: DC

## 2018-11-16 MED ORDER — OMEPRAZOLE 20 MG PO CPDR: 20.0000 mg | DELAYED_RELEASE_CAPSULE | Freq: Every day | ORAL | 3 refills | Status: DC

## 2018-11-16 NOTE — Progress Notes (Signed)
BP 117/77   Pulse 70   Temp (!) 96.8 F (36 C)   LMP  (LMP Unknown)   SpO2 98%    Subjective:    Patient ID: Hannah Edwards, female    DOB: June 13, 1950, 68 y.o.   MRN: [id number]  HPI: Hannah Comberdith Moorehouse is a 68 y.o. female  Chief Complaint  Patient presents with  . Hypertension  . Hyperlipidemia  . prediabetes    . This visit was completed via WebEx due to the restrictions of the COVID-19 pandemic. All issues as above were discussed and addressed. Physical exam was done as above through visual confirmation on WebEx. If it was felt that the patient should be evaluated in the office, they were directed there. The patient verbally consented to this visit. . Location of the patient: home . Location of the provider: home . Those involved with this call:  . Provider: Roosvelt Maserachel Thorsten Climer, PA-C . CMA: Tiffany Reel, CMA . Front Desk/Registration: Harriet PhoJoliza Johnson  . Time spent on call: 25 minutes with patient face to face via video conference. More than 50% of this time was spent in counseling and coordination of care. 5 minutes total spent in review of patient's record and preparation of their chart. I verified patient identity using two factors (patient name and date of birth). Patient consents verbally to being seen via telemedicine visit today.   Patient presenting today for 6 month f/u. Taking her medicines faithfully without side effects. Denies CP, SOB, HAs, dizziness, myalgias, claudication, low blood sugar spells.   HTN - home BPs 110-120/70s when checked. Trying to follow DASH diet and stay active  HLD and IFG - diet controlled  GERD - doing well on prilosec, no breakthrough reflux sxs. Trying to eat smaller portions and not eat too late at night which helps.   Relevant past medical, surgical, family and social history reviewed and updated as indicated. Interim medical history since our last visit reviewed. Allergies and medications reviewed and updated.  Review of Systems  Per HPI unless  specifically indicated above     Objective:    BP 117/77   Pulse 70   Temp (!) 96.8 F (36 C)   LMP  (LMP Unknown)   SpO2 98%   Wt Readings from Last 3 Encounters:  05/17/18 282 lb 5 oz (128.1 kg)  01/13/18 272 lb 11.2 oz (123.7 kg)  12/19/16 276 lb 6.4 oz (125.4 kg)    Physical Exam Vitals signs and nursing note reviewed.  Constitutional:      General: She is not in acute distress.    Appearance: Normal appearance.  HENT:     Head: Atraumatic.     Right Ear: External ear normal.     Left Ear: External ear normal.     Nose: Nose normal. No congestion.     Mouth/Throat:     Mouth: Mucous membranes are moist.     Pharynx: Oropharynx is clear. No posterior oropharyngeal erythema.  Eyes:     Extraocular Movements: Extraocular movements intact.     Conjunctiva/sclera: Conjunctivae normal.  Neck:     Musculoskeletal: Normal range of motion.  Cardiovascular:     Comments: Unable to assess via virtual visit Pulmonary:     Effort: Pulmonary effort is normal. No respiratory distress.  Musculoskeletal: Normal range of motion.  Skin:    General: Skin is dry.     Findings: No erythema.  Neurological:     Mental Status: She is alert and oriented to person,  place, and time.  Psychiatric:        Mood and Affect: Mood normal.        Thought Content: Thought content normal.        Judgment: Judgment normal.     Results for orders placed or performed in visit on 05/17/18  Comprehensive metabolic panel  Result Value Ref Range   Glucose 92 65 - 99 mg/dL   BUN 13 8 - 27 mg/dL   Creatinine, Ser 0.75 0.57 - 1.00 mg/dL   GFR calc non Af Amer 82 >59 mL/min/1.73   GFR calc Af Amer 95 >59 mL/min/1.73   BUN/Creatinine Ratio 17 12 - 28   Sodium 144 134 - 144 mmol/L   Potassium 4.6 3.5 - 5.2 mmol/L   Chloride 104 96 - 106 mmol/L   CO2 24 20 - 29 mmol/L   Calcium 9.3 8.7 - 10.3 mg/dL   Total Protein 6.9 6.0 - 8.5 g/dL   Albumin 4.3 3.6 - 4.8 g/dL   Globulin, Total 2.6 1.5 - 4.5  g/dL   Albumin/Globulin Ratio 1.7 1.2 - 2.2   Bilirubin Total 0.3 0.0 - 1.2 mg/dL   Alkaline Phosphatase 82 39 - 117 IU/L   AST 15 0 - 40 IU/L   ALT 13 0 - 32 IU/L  Lipid Panel w/o Chol/HDL Ratio  Result Value Ref Range   Cholesterol, Total 200 (H) 100 - 199 mg/dL   Triglycerides 78 0 - 149 mg/dL   HDL 51 >39 mg/dL   VLDL Cholesterol Cal 16 5 - 40 mg/dL   LDL Calculated 133 (H) 0 - 99 mg/dL  HgB A1c  Result Value Ref Range   Hgb A1c MFr Bld 6.1 (H) 4.8 - 5.6 %   Est. average glucose Bld gHb Est-mCnc 128 mg/dL      Assessment & Plan:   Problem List Items Addressed This Visit      Cardiovascular and Mediastinum   Hypertension    Stable and WNL, continue current regimen      Relevant Medications   carvedilol (COREG) 12.5 MG tablet     Digestive   GERD (gastroesophageal reflux disease)    Under good control, continue prilosec regimen      Relevant Medications   omeprazole (PRILOSEC) 20 MG capsule     Endocrine   IFG (impaired fasting glucose)    Recheck A1C, adjust as needed. Continue good lifestyle modifications      Relevant Orders   HgB A1c     Other   Morbid obesity ([place]) - Primary    Discussed diet and exercise strategies      Hyperlipidemia    Recheck lipids, continue working on diet and exercise for control      Relevant Medications   carvedilol (COREG) 12.5 MG tablet   Other Relevant Orders   Lipid Panel w/o Chol/HDL Ratio   Comprehensive metabolic panel   BMI 83.3-82.5, adult ([place])       Follow up plan: Return in about 6 months (around 05/19/2019) for 6 month f/u.

## 2018-11-19 NOTE — Assessment & Plan Note (Signed)
Stable and WNL, continue current regimen 

## 2018-11-19 NOTE — Assessment & Plan Note (Signed)
Recheck A1C, adjust as needed. Continue good lifestyle modifications.  

## 2018-11-19 NOTE — Assessment & Plan Note (Signed)
Discussed diet and exercise strategies

## 2018-11-19 NOTE — Assessment & Plan Note (Signed)
Under good control, continue prilosec regimen

## 2018-11-19 NOTE — Assessment & Plan Note (Signed)
Recheck lipids, continue working on diet and exercise for control

## 2019-01-20 ENCOUNTER — Telehealth: Payer: Self-pay

## 2019-01-20 NOTE — Telephone Encounter (Signed)
Called to schedule medicare annual wellness visit with NHA- Azalea Cedar,LPN in office or virtually. Eligible anytime after 01/14/2019

## 2019-02-08 ENCOUNTER — Other Ambulatory Visit: Payer: Medicare Other

## 2019-02-08 ENCOUNTER — Other Ambulatory Visit: Payer: Self-pay

## 2019-02-08 ENCOUNTER — Ambulatory Visit (INDEPENDENT_AMBULATORY_CARE_PROVIDER_SITE_OTHER): Payer: Medicare Other

## 2019-02-08 DIAGNOSIS — R7301 Impaired fasting glucose: Secondary | ICD-10-CM | POA: Diagnosis not present

## 2019-02-08 DIAGNOSIS — E7800 Pure hypercholesterolemia, unspecified: Secondary | ICD-10-CM

## 2019-02-08 DIAGNOSIS — Z23 Encounter for immunization: Secondary | ICD-10-CM

## 2019-02-09 ENCOUNTER — Encounter: Payer: Self-pay | Admitting: Family Medicine

## 2019-02-09 LAB — COMPREHENSIVE METABOLIC PANEL WITH GFR
ALT: 9 IU/L (ref 0–32)
AST: 13 IU/L (ref 0–40)
Albumin/Globulin Ratio: 1.5 (ref 1.2–2.2)
Albumin: 4.1 g/dL (ref 3.8–4.8)
Alkaline Phosphatase: 82 IU/L (ref 39–117)
BUN/Creatinine Ratio: 14 (ref 12–28)
BUN: 12 mg/dL (ref 8–27)
Bilirubin Total: 0.3 mg/dL (ref 0.0–1.2)
CO2: 25 mmol/L (ref 20–29)
Calcium: 9.8 mg/dL (ref 8.7–10.3)
Chloride: 101 mmol/L (ref 96–106)
Creatinine, Ser: 0.88 mg/dL (ref 0.57–1.00)
GFR calc Af Amer: 78 mL/min/1.73 (ref >59)
GFR calc non Af Amer: 67 mL/min/1.73 (ref >59)
Globulin, Total: 2.8 g/dL (ref 1.5–4.5)
Glucose: 86 mg/dL (ref 65–99)
Potassium: 4.5 mmol/L (ref 3.5–5.2)
Sodium: 139 mmol/L (ref 134–144)
Total Protein: 6.9 g/dL (ref 6.0–8.5)

## 2019-02-09 LAB — LIPID PANEL W/O CHOL/HDL RATIO
Cholesterol, Total: 181 mg/dL (ref 100–199)
HDL: 48 mg/dL (ref >39)
LDL Chol Calc (NIH): 122 mg/dL — ABNORMAL HIGH (ref 0–99)
Triglycerides: 55 mg/dL (ref 0–149)
VLDL Cholesterol Cal: 11 mg/dL (ref 5–40)

## 2019-02-09 LAB — HEMOGLOBIN A1C
Est. average glucose Bld gHb Est-mCnc: 131 mg/dL
Hgb A1c MFr Bld: 6.2 % — ABNORMAL HIGH (ref 4.8–5.6)

## 2019-05-04 ENCOUNTER — Telehealth: Payer: Self-pay | Admitting: Family Medicine

## 2019-05-04 NOTE — Telephone Encounter (Signed)
I do not see a contraindication upon review, is there a specific concern she had about it?  Copied from [place] [id number]. Topic: General - Inquiry >> May 04, 2019 11:42 AM Richardo Priest, NT wrote: Reason for CRM: Pt called in stating her employer is wanting her to take the covid-19 vaccine. Pt wants to know if its ok for her to take due to her allergies. Please advise. >> May 04, 2019  3:30 PM Georgina Peer, CMA wrote: Is there any reason the patient cannot get a COVID vaccine?

## 2019-05-05 NOTE — Telephone Encounter (Signed)
Called and spoke with patient. Message relayed. Patient stated that she just wanted to know if she can get the covid 19 vaccine and she did not have any specific questions or concerns.

## 2019-06-29 ENCOUNTER — Telehealth: Payer: Self-pay

## 2019-06-29 ENCOUNTER — Encounter: Payer: Self-pay | Admitting: Family Medicine

## 2019-06-29 ENCOUNTER — Inpatient Hospital Stay
Admission: EM | Admit: 2019-06-29 | Discharge: 2019-06-30 | DRG: 178 | Disposition: A | Discharge status: Other Acute Inpatient Hospital | Payer: Medicare Other | Attending: Family Medicine | Admitting: Family Medicine

## 2019-06-29 ENCOUNTER — Encounter: Payer: Self-pay | Admitting: Emergency Medicine

## 2019-06-29 ENCOUNTER — Telehealth (INDEPENDENT_AMBULATORY_CARE_PROVIDER_SITE_OTHER): Payer: Medicare Other | Admitting: Family Medicine

## 2019-06-29 ENCOUNTER — Other Ambulatory Visit: Payer: Self-pay

## 2019-06-29 ENCOUNTER — Emergency Department: Payer: Medicare Other

## 2019-06-29 VITALS — BP 128/78 | HR 104 | Temp 98.6°F | Ht 65.0 in | Wt 281.0 lb

## 2019-06-29 DIAGNOSIS — Z87891 Personal history of nicotine dependence: Secondary | ICD-10-CM

## 2019-06-29 DIAGNOSIS — Z20822 Contact with and (suspected) exposure to covid-19: Secondary | ICD-10-CM | POA: Diagnosis not present

## 2019-06-29 DIAGNOSIS — Z800 Family history of malignant neoplasm of digestive organs: Secondary | ICD-10-CM

## 2019-06-29 DIAGNOSIS — Z6841 Body Mass Index (BMI) 40.0 and over, adult: Secondary | ICD-10-CM | POA: Diagnosis not present

## 2019-06-29 DIAGNOSIS — R0902 Hypoxemia: Secondary | ICD-10-CM | POA: Diagnosis present

## 2019-06-29 DIAGNOSIS — Z830 Family history of human immunodeficiency virus [HIV] disease: Secondary | ICD-10-CM

## 2019-06-29 DIAGNOSIS — Z79899 Other long term (current) drug therapy: Secondary | ICD-10-CM | POA: Diagnosis not present

## 2019-06-29 DIAGNOSIS — Z8249 Family history of ischemic heart disease and other diseases of the circulatory system: Secondary | ICD-10-CM

## 2019-06-29 DIAGNOSIS — I10 Essential (primary) hypertension: Secondary | ICD-10-CM | POA: Diagnosis present

## 2019-06-29 DIAGNOSIS — Z833 Family history of diabetes mellitus: Secondary | ICD-10-CM

## 2019-06-29 DIAGNOSIS — R0600 Dyspnea, unspecified: Secondary | ICD-10-CM | POA: Diagnosis not present

## 2019-06-29 DIAGNOSIS — U071 COVID-19: Secondary | ICD-10-CM | POA: Diagnosis not present

## 2019-06-29 DIAGNOSIS — A419 Sepsis, unspecified organism: Secondary | ICD-10-CM

## 2019-06-29 DIAGNOSIS — R6510 Systemic inflammatory response syndrome (SIRS) of non-infectious origin without acute organ dysfunction: Secondary | ICD-10-CM | POA: Diagnosis not present

## 2019-06-29 DIAGNOSIS — R0602 Shortness of breath: Secondary | ICD-10-CM | POA: Diagnosis not present

## 2019-06-29 DIAGNOSIS — I493 Ventricular premature depolarization: Secondary | ICD-10-CM | POA: Diagnosis present

## 2019-06-29 DIAGNOSIS — Z209 Contact with and (suspected) exposure to unspecified communicable disease: Secondary | ICD-10-CM | POA: Diagnosis not present

## 2019-06-29 DIAGNOSIS — R000 Tachycardia, unspecified: Secondary | ICD-10-CM | POA: Diagnosis not present

## 2019-06-29 LAB — COMPREHENSIVE METABOLIC PANEL
ALT: 16 U/L (ref 0–44)
AST: 25 U/L (ref 15–41)
Albumin: 3.9 g/dL (ref 3.5–5.0)
Alkaline Phosphatase: 55 U/L (ref 38–126)
Anion gap: 11 (ref 5–15)
BUN: 11 mg/dL (ref 8–23)
CO2: 25 mmol/L (ref 22–32)
Calcium: 8.8 mg/dL — ABNORMAL LOW (ref 8.9–10.3)
Chloride: 101 mmol/L (ref 98–111)
Creatinine, Ser: 0.95 mg/dL (ref 0.44–1.00)
GFR calc Af Amer: >60 mL/min (ref >60)
GFR calc non Af Amer: >60 mL/min (ref >60)
Glucose, Bld: 113 mg/dL — ABNORMAL HIGH (ref 70–99)
Potassium: 3.7 mmol/L (ref 3.5–5.1)
Sodium: 137 mmol/L (ref 135–145)
Total Bilirubin: 0.8 mg/dL (ref 0.3–1.2)
Total Protein: 7.7 g/dL (ref 6.5–8.1)

## 2019-06-29 LAB — CBC
HCT: 41.1 % (ref 36.0–46.0)
Hemoglobin: 13.2 g/dL (ref 12.0–15.0)
MCH: 29 pg (ref 26.0–34.0)
MCHC: 32.1 g/dL (ref 30.0–36.0)
MCV: 90.3 fL (ref 80.0–100.0)
Platelets: 291 10*3/uL (ref 150–400)
RBC: 4.55 MIL/uL (ref 3.87–5.11)
RDW: 14.6 % (ref 11.5–15.5)
WBC: 6.9 10*3/uL (ref 4.0–10.5)
nRBC: 0 % (ref 0.0–0.2)

## 2019-06-29 LAB — PROCALCITONIN: Procalcitonin: <0.1 ng/mL

## 2019-06-29 LAB — POC SARS CORONAVIRUS 2 AG: SARS Coronavirus 2 Ag: POSITIVE — AB

## 2019-06-29 LAB — LACTIC ACID, PLASMA: Lactic Acid, Venous: 1.4 mmol/L (ref 0.5–1.9)

## 2019-06-29 MED ORDER — SODIUM CHLORIDE 0.9 % IV SOLN
200.0000 mg | Freq: Once | INTRAVENOUS | Status: AC
  Administered 2019-06-30: 200 mg via INTRAVENOUS
  Filled 2019-06-29: qty 200

## 2019-06-29 MED ORDER — SODIUM CHLORIDE 0.9 % IV BOLUS
1000.0000 mL | Freq: Once | INTRAVENOUS | Status: AC
  Administered 2019-06-29: 23:00:00 1000 mL via INTRAVENOUS

## 2019-06-29 MED ORDER — ACETAMINOPHEN 500 MG PO TABS
1000.0000 mg | ORAL_TABLET | ORAL | Status: AC
  Administered 2019-06-29: 1000 mg via ORAL
  Filled 2019-06-29: qty 2

## 2019-06-29 MED ORDER — SODIUM CHLORIDE 0.9 % IV SOLN
100.0000 mg | Freq: Every day | INTRAVENOUS | Status: DC
  Filled 2019-06-29: qty 20

## 2019-06-29 MED ORDER — DEXAMETHASONE SODIUM PHOSPHATE 10 MG/ML IJ SOLN
6.0000 mg | INTRAMUSCULAR | Status: DC
  Administered 2019-06-30: 6 mg via INTRAVENOUS
  Filled 2019-06-29: qty 1

## 2019-06-29 MED ORDER — IBUPROFEN 600 MG PO TABS
600.0000 mg | ORAL_TABLET | ORAL | Status: AC
  Administered 2019-06-29: 23:00:00 600 mg via ORAL
  Filled 2019-06-29: qty 1

## 2019-06-29 MED ORDER — ACETAMINOPHEN 325 MG PO TABS: 650.0000 mg | ORAL_TABLET | ORAL | Status: DC | PRN

## 2019-06-29 MED ORDER — SODIUM CHLORIDE 0.9 % IV SOLN: INTRAVENOUS | Status: DC

## 2019-06-29 NOTE — Telephone Encounter (Signed)
Scheduled virtual today 3:15

## 2019-06-29 NOTE — ED Provider Notes (Signed)
[facility] Emergency Department Provider Note ____________________________________________   First MD Initiated Contact with Patient 06/29/19 2219     (approximate)  I have reviewed the triage vital signs and the nursing notes.   HISTORY  Chief Complaint Generalized Body Aches and Shortness of Breath  HPI Hannah Edwards is a 69 y.o. 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  On Monday she started to develop body aches and fever  She has additionally had a sore throat.  Increasing shortness of breath.  Cough.  No nausea or vomiting.  Does have a history of hypertension.  Not currently having pain except muscle aches.  History reviewed. No pertinent past medical history.  Patient Active Problem List   Diagnosis Date Noted  . BMI 45.0-49.9, adult (HCC) 05/17/2018  . Ceruminosis, left 12/10/2016  . Advanced care planning/counseling discussion 12/10/2016  . IFG (impaired fasting glucose) 05/11/2015  . GERD (gastroesophageal reflux disease) 01/04/2015  . Hypertension 01/04/2015  . Morbid obesity (HCC) 01/04/2015  . Osteopenia 01/04/2015  . Hyperlipidemia 01/04/2015  . Allergic rhinitis 01/04/2015    Past Surgical History:  Procedure Laterality Date  . ABDOMINAL HYSTERECTOMY    . WRIST SURGERY Left     Prior to Admission medications   Medication Sig Start Date End Date Taking? Authorizing Provider  carvedilol (COREG) 12.5 MG tablet Take 1 tablet (12.5 mg total) by mouth 2 (two) times daily with a meal. 11/16/18   Particia Nearing, PA-C  omeprazole (PRILOSEC) 20 MG capsule Take 1 capsule (20 mg total) by mouth daily. 11/16/18   Particia Nearing, PA-C    Allergies Ace inhibitors, Atorvastatin, and Lisinopril  Family History  Problem Relation Age of Onset  . Hypertension Mother   . Cancer Mother        unknown  type   . Hypertension Father   . Aneurysm Father   . Stomach cancer Sister   . Diabetes Brother   . HIV/AIDS Brother   . Breast cancer Neg Hx   . Lung cancer Neg Hx     Social History Social History   Tobacco Use  . Smoking status: Former Smoker    Types: Cigarettes    Quit date: 01/05/1975    Years since quitting: 44.5  . Smokeless tobacco: Never Used  Substance Use Topics  . Alcohol use: No    Alcohol/week: 0.0 standard drinks  . Drug use: No    Review of Systems Constitutional: Fevers chills body aches  eyes: No visual changes. ENT: No sore throat except a slight sore throat, feels like there is a little strange sensation on her tongue. Cardiovascular: Denies chest pain. Respiratory: Positive short of breath, mild to moderate Gastrointestinal: No abdominal pain.   Musculoskeletal: Negative for back pain.  Generalized muscle aches Skin: Negative for rash. Neurological: Negative for headaches, areas of focal weakness or numbness.  She does occasionally having some mild off-and-on headache but none now.    ____________________________________________   PHYSICAL EXAM:  VITAL SIGNS: ED Triage Vitals  Enc Vitals Group     BP 06/29/19 2213 (!) 140/92     Pulse Rate 06/29/19 2213 (!) 122     Resp 06/29/19 2213 (!) 28     Temp 06/29/19 2213 (!) 102.2 F (39 C)     Temp src --      SpO2 06/29/19  2213 94 %     Weight 06/29/19 2212 279 lb 15.8 oz (127 kg)     Height 06/29/19 2212 5\' 5"  (1.651 m)     Head Circumference --      Peak Flow --      Pain Score 06/29/19 2212 0     Pain Loc --      Pain Edu? --      Excl. in [place]? --     Constitutional: Alert and oriented.  Mild to moderately ill-appearing appears dyspneic, generally ill.  No acute extremitas though. Eyes: Conjunctivae are normal. Head: Atraumatic. Nose: No congestion/rhinnorhea. Mouth/Throat: Mucous membranes are moist. Neck: No stridor.  Cardiovascular: Tachycardic rate, regular rhythm. Grossly  normal heart sounds.  Good peripheral circulation. Respiratory: Moderate tachypnea, mild use accessory muscles, speaks in phrases.  Lung sounds clear, but distant bilaterally. Gastrointestinal: Soft and nontender. No distention. Musculoskeletal: No lower extremity tenderness nor edema. Neurologic:  Normal speech and language. No gross focal neurologic deficits are appreciated.  Skin:  Skin is warm, dry and intact. No rash noted. Psychiatric: Mood and affect are normal. Speech and behavior are normal.  ____________________________________________   LABS (all labs ordered are listed, but only abnormal results are displayed)  Labs Reviewed  COMPREHENSIVE METABOLIC PANEL - Abnormal; Notable for the following components:      Result Value   Glucose, Bld 113 (*)    Calcium 8.8 (*)    All other components within normal limits  POC SARS CORONAVIRUS 2 AG - Abnormal; Notable for the following components:   SARS Coronavirus 2 Ag POSITIVE (*)    All other components within normal limits  CBC  LACTIC ACID, PLASMA  PROCALCITONIN  URINALYSIS, COMPLETE (UACMP) WITH MICROSCOPIC  POC SARS CORONAVIRUS 2 AG -  ED   ____________________________________________  EKG  Reviewed inter by me at 2215 Heart rate 120 QRS 60 QTc 480 Sinus tachycardia, nonspecific T wave abnormalities.  No evidence acute ischemia. ____________________________________________  RADIOLOGY  DG Chest Portable 1 View  Result Date: 06/29/2019 CLINICAL DATA:  Dyspnea EXAM: PORTABLE CHEST 1 VIEW COMPARISON:  12/11/2016 FINDINGS: The heart size and mediastinal contours are within normal limits. Both lungs are clear. The visualized skeletal structures are unremarkable. IMPRESSION: No active disease. Electronically Signed   By: Randa Ngo M.D.   On: 06/29/2019 22:50     ____________________________________________   PROCEDURES  Procedure(s) performed: None  Procedures  Critical Care performed: Yes, see critical care  note(s)  CRITICAL CARE Performed by: Delman Kitten   Total critical care time: 25 minutes  Critical care time was exclusive of separately billable procedures and treating other patients.  Critical care was necessary to treat or prevent imminent or life-threatening deterioration.  Critical care was time spent personally by me on the following activities: development of treatment plan with patient and/or surrogate as well as nursing, discussions with consultants, evaluation of patient's response to treatment, examination of patient, obtaining history from patient or surrogate, ordering and performing treatments and interventions, ordering and review of laboratory studies, ordering and review of radiographic studies, pulse oximetry and re-evaluation of patient's condition.  Patient presents, meets sepsis criteria.  However does not show evidence of hypotension, awaiting lactate.  She is febrile, tachycardic tachypneic and highly suspected to possibly have COVID-19 based on the clinical history of known exposures.  Await further testing and lab work though.  Will hydrate, provide antipyretics.  Has moderate increased work of breathing, provide oxygen therapy for comfort ____________________________________________  INITIAL IMPRESSION / ASSESSMENT AND PLAN / ED COURSE  Pertinent labs & imaging results that were available during my care of the patient were reviewed by me and considered in my medical decision making (see chart for details).   Patient presents with concerns for possible Covid.  She tested positive here, her clinical history seems to fit that picture as well.  Await additional test such as procalcitonin to evaluate for other illness such as bacterial infection, but given the clinical history she provides seems to be consistent with COVID-19 infection  We will admit to the hospitalist, discussed case and care with Dr. Arville Care  ----------------------------------------- 10:48 PM on  06/29/2019 -----------------------------------------  Patient reports improvement in symptoms with oxygen and fluids at this time.  Understandable agreeable with plan for admission to the hospital, also discussed with her that there is some chance she may be asked to transfer to [facility] for ongoing care by her hospitalist team, patient also seems to agree that this would be reasonable with her as well.      ____________________________________________   FINAL CLINICAL IMPRESSION(S) / ED DIAGNOSES  Final diagnoses:  COVID-19  Sepsis, due to unspecified organism, unspecified whether acute organ dysfunction present [facility])        Note:  This document was prepared using Dragon voice recognition software and may include unintentional dictation errors       [name], MD 06/29/19 2342

## 2019-06-29 NOTE — Telephone Encounter (Signed)
Please see about getting patient scheduled for a virtual visit. Copied from CRM [id number]. Topic: General - Inquiry >> Jun 29, 2019 10:06 AM Hannah Edwards wrote: Reason for CRM: Patient states she was tested for covid yesterday, patient does not have covid results yet.  Patient is having chills, patient states "ice water in veins". Patient states she is tired and just wants to sleep.  Call back [phone] PHARMACY [facility], [address], [address]

## 2019-06-29 NOTE — ED Triage Notes (Signed)
Pt arrival via [facility] from work. 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 Someone in the group home did test positive for covid and she was tested, but does not have results back. Pt complains of fevers/body aches and shob.  VS with EMS: -BP 158/74 HR 123 RR 20

## 2019-06-29 NOTE — Progress Notes (Signed)
Remdesivir - Pharmacy Brief Note   O:  CXR: No active disease SpO2: 95 - 99% on 2L [place]   A/P:  Remdesivir 200 mg IVPB once followed by 100 mg IVPB daily x 4 days.   Thomasene Ripple, PharmD, BCPS Clinical Pharmacist 06/29/2019 11:10 PM

## 2019-06-29 NOTE — Progress Notes (Signed)
BP 128/78   Pulse (!) 104   Temp 98.6 F (37 C) (Temporal)   Ht 5\' 5"  (1.651 m)   Wt 281 lb (127.5 kg)   LMP  (LMP Unknown)   BMI 46.76 kg/m    Subjective:    Patient ID: Hannah Edwards, female    DOB: [birth year], 69 y.o.   MRN: [id number]  HPI: Hannah Edwards is a 69 y.o. female  Chief Complaint  Patient presents with  . Chills    symptoms started this past Monday. awating for covid 19 test results  . Fatigue  . Headache    . This visit was completed via MyChart due to the restrictions of the COVID-19 pandemic. All issues as above were discussed and addressed. Physical exam was done as above through visual confirmation on MyChart. If it was felt that the patient should be evaluated in the office, they were directed there. The patient verbally consented to this visit. . Location of the patient: work . Location of the provider: work . Those involved with this call:  . Provider: Merrie Roof, PA-C . CMA: Lesle Chris, [place] . Front Desk/Registration: Jill Side  . Time spent on call: 15 minutes with patient face to face via video conference. More than 50% of this time was spent in counseling and coordination of care. 5 minutes total spent in review of patient's record and preparation of their chart. I verified patient identity using two factors (patient name and date of birth). Patient consents verbally to being seen via telemedicine visit today.   Had exposure to COVID 19, and now having 2 days of chills, feeling cold, fatigued, headaches. Has been tested for COVID and awaiting results. Denies persistent fever (had one one day that she's aware of, around 100.3), cough, SOB, N/V, anorexia. Taking cold medication, benadryl and trying to rest, has inhaler she's using prn for mild chest tightness. Currently at work, needing work note to be out. Denies hx of asthma or COPD.   Relevant past medical, surgical, family and social history reviewed and updated as indicated. Interim medical  history since our last visit reviewed. Allergies and medications reviewed and updated.  Review of Systems  Per HPI unless specifically indicated above     Objective:    BP 128/78   Pulse (!) 104   Temp 98.6 F (37 C) (Temporal)   Ht 5\' 5"  (1.651 m)   Wt 281 lb (127.5 kg)   LMP  (LMP Unknown)   BMI 46.76 kg/m   Wt Readings from Last 3 Encounters:  06/29/19 279 lb 15.8 oz (127 kg)  06/29/19 281 lb (127.5 kg)  05/17/18 282 lb 5 oz (128.1 kg)    Physical Exam Vitals and nursing note reviewed.  Constitutional:      General: She is not in acute distress.    Appearance: Normal appearance.  HENT:     Head: Atraumatic.     Right Ear: External ear normal.     Left Ear: External ear normal.     Nose: Nose normal. No congestion.     Mouth/Throat:     Mouth: Mucous membranes are moist.     Pharynx: Oropharynx is clear. No posterior oropharyngeal erythema.  Eyes:     Extraocular Movements: Extraocular movements intact.     Conjunctiva/sclera: Conjunctivae normal.  Cardiovascular:     Comments: Unable to assess via virtual visit Pulmonary:     Effort: Pulmonary effort is normal. No respiratory distress.     Comments: Speaking  in full sentences comfortably, does not appear to have labored breathing during video visit. No audible wheezes through this modality though unable to directly auscultate her lungs Musculoskeletal:        General: Normal range of motion.     Cervical back: Normal range of motion.  Skin:    General: Skin is dry.     Findings: No erythema.  Neurological:     Mental Status: She is alert and oriented to person, place, and time.  Psychiatric:        Mood and Affect: Mood normal.        Thought Content: Thought content normal.        Judgment: Judgment normal.     Results for orders placed or performed in visit on 02/08/19  HgB A1c  Result Value Ref Range   Hgb A1c MFr Bld 6.2 (H) 4.8 - 5.6 %   Est. average glucose Bld gHb Est-mCnc 131 mg/dL    Comprehensive metabolic panel  Result Value Ref Range   Glucose 86 65 - 99 mg/dL   BUN 12 8 - 27 mg/dL   Creatinine, Ser 1.74 0.57 - 1.00 mg/dL   GFR calc non Af Amer 67 >59 mL/min/1.73   GFR calc Af Amer 78 >59 mL/min/1.73   BUN/Creatinine Ratio 14 12 - 28   Sodium 139 134 - 144 mmol/L   Potassium 4.5 3.5 - 5.2 mmol/L   Chloride 101 96 - 106 mmol/L   CO2 25 20 - 29 mmol/L   Calcium 9.8 8.7 - 10.3 mg/dL   Total Protein 6.9 6.0 - 8.5 g/dL   Albumin 4.1 3.8 - 4.8 g/dL   Globulin, Total 2.8 1.5 - 4.5 g/dL   Albumin/Globulin Ratio 1.5 1.2 - 2.2   Bilirubin Total 0.3 0.0 - 1.2 mg/dL   Alkaline Phosphatase 82 39 - 117 IU/L   AST 13 0 - 40 IU/L   ALT 9 0 - 32 IU/L  Lipid Panel w/o Chol/HDL Ratio  Result Value Ref Range   Cholesterol, Total 181 100 - 199 mg/dL   Triglycerides 55 0 - 149 mg/dL   HDL 48 >94 mg/dL   VLDL Cholesterol Cal 11 5 - 40 mg/dL   LDL Chol Calc (NIH) 496 (H) 0 - 99 mg/dL      Assessment & Plan:   Problem List Items Addressed This Visit    None    Visit Diagnoses    Suspected COVID-19 virus infection    -  Primary     Awaiting COVID 19 results, sxs consistent and + exposure. Discussed that she needed to leave work and begin isolation as soon as possible, will fax over a work note to the number she has provided for her work. Appears in no distress at this time, mainly complaining of fatigue, headache and chills. Discussed supportive home care with rest, fluids, continued OTC remedies with tylenol, cold medications. Strict return precautions reviewed for new or worsening sxs.   Follow up plan: No follow-ups on file.

## 2019-06-29 NOTE — H&P (Addendum)
[place] [place] [facility] [place] NAME: Hannah Edwards    MR#:  [id number]  DATE OF BIRTH:  July 05, 1950  DATE OF ADMISSION:  06/29/2019  PRIMARY CARE PHYSICIAN: Volney American, PA-C   REQUESTING/REFERRING PHYSICIAN: Delman Kitten, MD  CHIEF COMPLAINT:   Chief Complaint  Patient presents with  . Generalized Body Aches  . Shortness of Breath    HISTORY OF PRESENT ILLNESS:  Hannah Edwards  is a 69 y.o. female with a known history of hypertension and obesity, who presented to the emergency room with Onset of fever and chills as well as body aches since Monday.  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  She has not had much appetite.  She admits to dyspnea and wheezing as well as rhinorrhea but denied cough.  No nausea vomiting or diarrhea or abdominal pain.  She denied loss of taste or smell.  No dysuria, oliguria or hematuria or flank pain.  Upon presentation to the emergency room, heart rate was 104 and temperature was initially normal later 102.2 with respiratory to 28 and heart rate 122 later on with a blood pressure of 140/92.  Pulse symmetry was 92 to 94% and on 2 L O2 by nasal cannula was up to 95 then 98%.  Labs revealed procalcitonin less than 0.1, unremarkable CBC and CMP and lactic acid 1.4.  COVID-19 antigen test came back positive.  Portable chest ray showed no acute cardiopulmonary disease with chronic changes similar to prior chest x-ray..EKG showed sinus tachycardia with a rate of 123 with PVCs and poor R wave progression.  The patient was given 1 g p.o. Tylenol and 600 mg of p.o. ibuprofen, 1 L bolus of IV normal saline and was started on IV remdesivir.  She will be admitted to medical monitored bed pending transfer to [place]:  Hypertension and obesity  PAST SURGICAL HISTORY:   Past Surgical History:  Procedure Laterality Date  . ABDOMINAL HYSTERECTOMY    . WRIST SURGERY Left     SOCIAL  HISTORY:   Social History   Tobacco Use  . Smoking status: Former Smoker    Types: Cigarettes    Quit date: 01/05/1975    Years since quitting: 44.5  . Smokeless tobacco: Never Used  Substance Use Topics  . Alcohol use: No    Alcohol/week: 0.0 standard drinks    FAMILY HISTORY:   Family History  Problem Relation Age of Onset  . Hypertension Mother   . Cancer Mother        unknown type   . Hypertension Father   . Aneurysm Father   . Stomach cancer Sister   . Diabetes Brother   . HIV/AIDS Brother   . Breast cancer Neg Hx   . Lung cancer Neg Hx     DRUG ALLERGIES:   Allergies  Allergen Reactions  . Ace Inhibitors Swelling  . Atorvastatin Other (See Comments)    Body aches  . Lisinopril     REVIEW OF SYSTEMS:   ROS As per history of present illness. All pertinent systems were reviewed above. Constitutional,  HEENT, cardiovascular, respiratory, GI, GU, musculoskeletal, neuro, psychiatric, endocrine,  integumentary and hematologic systems were reviewed and are otherwise  negative/unremarkable except for positive findings mentioned above in the HPI.   MEDICATIONS AT HOME:   Prior to Admission medications   Medication Sig Start Date End Date Taking? Authorizing Provider  carvedilol (COREG) 12.5 MG tablet  Take 1 tablet (12.5 mg total) by mouth 2 (two) times daily with a meal. 11/16/18   Particia Nearing, PA-C  omeprazole (PRILOSEC) 20 MG capsule Take 1 capsule (20 mg total) by mouth daily. 11/16/18   Particia Nearing, PA-C      VITAL SIGNS:  Blood pressure 120/83, pulse (!) 114, temperature (!) 102.2 F (39 C), resp. rate 18, height 5\' 5"  (1.651 m), weight 127 kg, SpO2 95 %.  PHYSICAL EXAMINATION:  Physical Exam  GENERAL:  69 y.o.-year-old African-American obese patient lying in the bed with mild respiratory distress with conversational dyspnea.  EYES: Pupils equal, round, reactive to light and accommodation. No scleral icterus. Extraocular muscles  intact.  HEENT: Head atraumatic, normocephalic. Oropharynx and nasopharynx clear.  NECK:  Supple, no jugular venous distention. No thyroid enlargement, no tenderness.  LUNGS: Left basal and midlung zone crackles. CARDIOVASCULAR: Regular rate and rhythm, S1, S2 normal. No murmurs, rubs, or gallops.  ABDOMEN: Soft, nondistended, nontender. Bowel sounds present. No organomegaly or mass.  EXTREMITIES: No pedal edema, cyanosis, or clubbing.  NEUROLOGIC: Cranial nerves II through XII are intact. Muscle strength 5/5 in all extremities. Sensation intact. Gait not checked.  PSYCHIATRIC: The patient is alert and oriented x 3.  Normal affect and good eye contact. SKIN: No obvious rash, lesion, or ulcer.   LABORATORY PANEL:   CBC Recent Labs  Lab 06/29/19 2219  WBC 6.9  HGB 13.2  HCT 41.1  PLT 291   ------------------------------------------------------------------------------------------------------------------  Chemistries  Recent Labs  Lab 06/29/19 2219  NA 137  K 3.7  CL 101  CO2 25  GLUCOSE 113*  BUN 11  CREATININE 0.95  CALCIUM 8.8*  AST 25  ALT 16  ALKPHOS 55  BILITOT 0.8   ------------------------------------------------------------------------------------------------------------------  Cardiac Enzymes No results for input(s): TROPONINI in the last 168 hours. ------------------------------------------------------------------------------------------------------------------  RADIOLOGY:  DG Chest Portable 1 View  Result Date: 06/29/2019 CLINICAL DATA:  Dyspnea EXAM: PORTABLE CHEST 1 VIEW COMPARISON:  12/11/2016 FINDINGS: The heart size and mediastinal contours are within normal limits. Both lungs are clear. The visualized skeletal structures are unremarkable. IMPRESSION: No active disease. Electronically Signed   By: 02/10/2017 M.D.   On: 06/29/2019 22:50      IMPRESSION AND PLAN:   1.  COVID-19.  This is manifested by SIRS and mild hypoxemia in the setting of  morbid obesity though chest x-ray is not showing atypical pneumonia yet. -The patient will be admitted to the medical monitored bed. -She will be started on IV remdesivir. -Given mild hypoxemia we added IV Decadron pending inflammatory markers. -O2 protocol will be followed. -Contact was made with [facility] and the patient was accepted for transfer. -We will place the patient on p.o. aspirin as well as vitamin D3.  Will check D-dimer level.  2.  Hypertension. -We will continue Coreg.  3.  DVT prophylaxis. -Subcutaneous Lovenox.  All the records are reviewed and case discussed with ED provider. The plan of care was discussed in details with the patient (and family). I answered all questions. The patient agreed to proceed with the above mentioned plan. Further management will depend upon hospital course.   CODE STATUS: Full code  TOTAL TIME TAKING CARE OF THIS PATIENT: 50 minutes.    [facility] M.D on 06/29/2019 at 11:08 PM  [facility]   From 7 PM-7 AM, contact night-coverage [email or web address]  CC: Primary care physician; 07/01/2019, PA-C   Note: This dictation was prepared  with Dragon dictation along with smaller phrase technology. Any transcriptional errors that result from this process are unintentional.

## 2019-06-30 ENCOUNTER — Encounter (HOSPITAL_COMMUNITY): Payer: Self-pay | Admitting: Family Medicine

## 2019-06-30 ENCOUNTER — Inpatient Hospital Stay (HOSPITAL_COMMUNITY)
Admission: EM | Admit: 2019-06-30 | Discharge: 2019-07-03 | DRG: 177 | Disposition: A | Discharge status: Home / Self Care | Payer: Medicare Other | Source: Other Acute Inpatient Hospital | Attending: Internal Medicine | Admitting: Internal Medicine

## 2019-06-30 DIAGNOSIS — I493 Ventricular premature depolarization: Secondary | ICD-10-CM | POA: Diagnosis not present

## 2019-06-30 DIAGNOSIS — U071 COVID-19: Principal | ICD-10-CM

## 2019-06-30 DIAGNOSIS — Z800 Family history of malignant neoplasm of digestive organs: Secondary | ICD-10-CM

## 2019-06-30 DIAGNOSIS — I10 Essential (primary) hypertension: Secondary | ICD-10-CM | POA: Diagnosis present

## 2019-06-30 DIAGNOSIS — Z833 Family history of diabetes mellitus: Secondary | ICD-10-CM

## 2019-06-30 DIAGNOSIS — Z8249 Family history of ischemic heart disease and other diseases of the circulatory system: Secondary | ICD-10-CM | POA: Diagnosis not present

## 2019-06-30 DIAGNOSIS — Z6841 Body Mass Index (BMI) 40.0 and over, adult: Secondary | ICD-10-CM | POA: Diagnosis not present

## 2019-06-30 DIAGNOSIS — J1282 Pneumonia due to coronavirus disease 2019: Secondary | ICD-10-CM | POA: Diagnosis present

## 2019-06-30 DIAGNOSIS — Z90710 Acquired absence of both cervix and uterus: Secondary | ICD-10-CM | POA: Diagnosis not present

## 2019-06-30 DIAGNOSIS — Z87891 Personal history of nicotine dependence: Secondary | ICD-10-CM | POA: Diagnosis not present

## 2019-06-30 DIAGNOSIS — R0902 Hypoxemia: Secondary | ICD-10-CM | POA: Diagnosis not present

## 2019-06-30 DIAGNOSIS — J9601 Acute respiratory failure with hypoxia: Secondary | ICD-10-CM | POA: Diagnosis present

## 2019-06-30 LAB — C-REACTIVE PROTEIN: CRP: 4.2 mg/dL — ABNORMAL HIGH (ref <1.0)

## 2019-06-30 LAB — FIBRIN DERIVATIVES D-DIMER (ARMC ONLY): Fibrin derivatives D-dimer (ARMC): 887.06 ng/mL (FEU) — ABNORMAL HIGH (ref 0.00–499.00)

## 2019-06-30 LAB — LACTATE DEHYDROGENASE: LDH: 151 U/L (ref 98–192)

## 2019-06-30 LAB — FERRITIN: Ferritin: 136 ng/mL (ref 11–307)

## 2019-06-30 MED ORDER — ACETAMINOPHEN 325 MG PO TABS: 650.0000 mg | ORAL_TABLET | Freq: Four times a day (QID) | ORAL | Status: DC | PRN

## 2019-06-30 MED ORDER — CARVEDILOL 12.5 MG PO TABS
12.5000 mg | ORAL_TABLET | Freq: Two times a day (BID) | ORAL | Status: DC
  Administered 2019-06-30 – 2019-07-03 (×7): 12.5 mg via ORAL
  Filled 2019-06-30 (×7): qty 1

## 2019-06-30 MED ORDER — ENOXAPARIN SODIUM 60 MG/0.6ML ~~LOC~~ SOLN
60.0000 mg | SUBCUTANEOUS | Status: DC
  Administered 2019-06-30 – 2019-07-03 (×4): 60 mg via SUBCUTANEOUS
  Filled 2019-06-30 (×4): qty 0.6

## 2019-06-30 MED ORDER — ASPIRIN EC 81 MG PO TBEC: 81.0000 mg | DELAYED_RELEASE_TABLET | Freq: Every day | ORAL | Status: DC

## 2019-06-30 MED ORDER — SODIUM CHLORIDE 0.9% FLUSH
3.0000 mL | Freq: Two times a day (BID) | INTRAVENOUS | Status: DC
  Administered 2019-06-30 – 2019-07-03 (×7): 3 mL via INTRAVENOUS

## 2019-06-30 MED ORDER — ONDANSETRON HCL 4 MG/2ML IJ SOLN: 4.0000 mg | Freq: Four times a day (QID) | INTRAMUSCULAR | Status: DC | PRN

## 2019-06-30 MED ORDER — VITAMIN D 25 MCG (1000 UNIT) PO TABS: 1000.0000 [IU] | ORAL_TABLET | Freq: Every day | ORAL | Status: DC

## 2019-06-30 MED ORDER — GUAIFENESIN-DM 100-10 MG/5ML PO SYRP: 10.0000 mL | ORAL_SOLUTION | ORAL | Status: DC | PRN

## 2019-06-30 MED ORDER — PANTOPRAZOLE SODIUM 40 MG PO TBEC
40.0000 mg | DELAYED_RELEASE_TABLET | Freq: Every day | ORAL | Status: DC
  Administered 2019-06-30 – 2019-07-03 (×4): 40 mg via ORAL
  Filled 2019-06-30 (×4): qty 1

## 2019-06-30 MED ORDER — ONDANSETRON HCL 4 MG PO TABS: 4.0000 mg | ORAL_TABLET | Freq: Four times a day (QID) | ORAL | Status: DC | PRN

## 2019-06-30 MED ORDER — HYDROCODONE-ACETAMINOPHEN 5-325 MG PO TABS: 1.0000 | ORAL_TABLET | Freq: Four times a day (QID) | ORAL | Status: DC | PRN

## 2019-06-30 MED ORDER — SODIUM CHLORIDE 0.9 % IV SOLN
100.0000 mg | Freq: Every day | INTRAVENOUS | Status: AC
  Administered 2019-06-30 – 2019-07-03 (×4): 100 mg via INTRAVENOUS
  Filled 2019-06-30 (×5): qty 20

## 2019-06-30 MED ORDER — DEXAMETHASONE SODIUM PHOSPHATE 10 MG/ML IJ SOLN
6.0000 mg | INTRAMUSCULAR | Status: AC
  Administered 2019-06-30 – 2019-07-02 (×3): 6 mg via INTRAVENOUS
  Filled 2019-06-30 (×4): qty 1

## 2019-06-30 MED ORDER — SENNOSIDES-DOCUSATE SODIUM 8.6-50 MG PO TABS: 1.0000 | ORAL_TABLET | Freq: Every evening | ORAL | Status: DC | PRN

## 2019-06-30 NOTE — ED Notes (Signed)
D-dimer recollected and sent to lab.

## 2019-06-30 NOTE — ED Notes (Signed)
Pt signed paper copy of transfer consent 

## 2019-06-30 NOTE — Progress Notes (Signed)
Pts son returned phone call. Pt spoke to son.

## 2019-06-30 NOTE — Progress Notes (Signed)
Hannah Edwards  JXB:[id number] DOB: 25-Jul-1950 DOA: 06/30/2019 PCP: Volney American, PA-C    Brief Narrative:  69 year old with a history of HTN and morbid obesity who presented to the [facility] ED with 3 days of fevers, chills, body aches, and shortness of breath after a sick contact at work.  In the ED she was found to have a saturation of 88% on room air, was tachycardic, and a CXR was without evidence of a clear pulmonary infiltrate.  Her Covid test was positive.  Significant Events: 2/24 COVID test + - admit to [facility] via [facility] ED  COVID-19 specific Treatment: Decadron 2/24 > Remdesivir 2/24 >  Antimicrobials:  None  Subjective: Maintaining saturations in the 94-97% range on 2 L nasal cannula.  Denies chest pain, nausea vomiting, abdominal pain.  Feels mild shortness of breath when exerting herself.  Assessment & Plan:  COVID Pneumonia - acute hypoxic respiratory failure Continue Decadron and remdesivir -chest x-ray at present without clear infiltrate -mobilize -incentive spirometer  Recent Labs  Lab 06/29/19 2219 06/30/19 0054  FERRITIN  --  136  ALT 16  --   PROCALCITON <0.10  --     HTN Blood pressure presently well controlled  Obesity - Body mass index is 47.03 kg/m.   DVT prophylaxis: Lovenox Code Status: FULL CODE Family Communication:  Disposition Plan: Anticipate discharge home in 3-4 days  Consultants:  none  Objective: Blood pressure 135/76, pulse 81, temperature 97.6 F (36.4 C), temperature source Oral, resp. rate 20, height 5\' 5"  (1.651 m), weight 128.2 kg. No intake or output data in the 24 hours ending 06/30/19 0839 Filed Weights   06/30/19 0653  Weight: 128.2 kg    Examination: General: No acute respiratory distress Lungs: Clear to auscultation bilaterally without wheezes or crackles Cardiovascular: Regular rate and rhythm without murmur gallop or rub normal S1 and S2 Abdomen: Nontender, nondistended, soft, bowel sounds positive, no  rebound, no ascites, no appreciable mass Extremities: No significant cyanosis, clubbing, or edema bilateral lower extremities  CBC: Recent Labs  Lab 06/29/19 2219  WBC 6.9  HGB 13.2  HCT 41.1  MCV 90.3  PLT 130   Basic Metabolic Panel: Recent Labs  Lab 06/29/19 2219  NA 137  K 3.7  CL 101  CO2 25  GLUCOSE 113*  BUN 11  CREATININE 0.95  CALCIUM 8.8*   GFR: Estimated Creatinine Clearance: 74.4 mL/min (by C-G formula based on SCr of 0.95 mg/dL).  Liver Function Tests: Recent Labs  Lab 06/29/19 2219  AST 25  ALT 16  ALKPHOS 55  BILITOT 0.8  PROT 7.7  ALBUMIN 3.9    HbA1C: HB A1C (BAYER DCA - WAIVED)  Date/Time Value Ref Range Status  05/11/2015 10:01 AM 6.3 <7.0 % Final    Comment:                                          Diabetic Adult            <7.0                                       Healthy Adult        4.3 - 5.7                                                           (  DCCT/NGSP) American Diabetes Association's Summary of Glycemic Recommendations for Adults with Diabetes: Hemoglobin A1c <7.0%. More stringent glycemic goals (A1c <6.0%) may further reduce complications at the cost of increased risk of hypoglycemia.    Hgb A1c MFr Bld  Date/Time Value Ref Range Status  02/08/2019 02:51 PM 6.2 (H) 4.8 - 5.6 % Final    Comment:             Prediabetes: 5.7 - 6.4          Diabetes: >6.4          Glycemic control for adults with diabetes: <7.0   05/17/2018 01:23 PM 6.1 (H) 4.8 - 5.6 % Final    Comment:             Prediabetes: 5.7 - 6.4          Diabetes: >6.4          Glycemic control for adults with diabetes: <7.0     CBG: No results for input(s): GLUCAP in the last 168 hours.  No results found for this or any previous visit (from the past 240 hour(s)).   Scheduled Meds: . carvedilol  12.5 mg Oral BID WC  . dexamethasone (DECADRON) injection  6 mg Intravenous Q24H  . enoxaparin (LOVENOX) injection  60 mg Subcutaneous Q24H  . pantoprazole   40 mg Oral Daily  . sodium chloride flush  3 mL Intravenous Q12H   Continuous Infusions: . remdesivir 100 mg in NS 100 mL       LOS: 0 days   Lonia Blood, MD [facility] Office  [phone] Pager - Text Page per Loretha Stapler  If 7PM-7AM, please contact night-coverage per Amion 06/30/2019, 8:39 AM

## 2019-06-30 NOTE — ED Notes (Signed)
Pt ambulatory to bathroom at this time.

## 2019-06-30 NOTE — ED Notes (Signed)
Ambulatory sat 88% on room air

## 2019-06-30 NOTE — ED Notes (Signed)
Report given to [facility] 

## 2019-06-30 NOTE — H&P (Signed)
History and Physical    Hannah Edwards KGY:[id number] DOB: 1951/04/01 DOA: 06/30/2019  PCP: Volney American, PA-C   Patient coming from: Home   Chief Complaint: Fever, chills, aches, SOB   HPI: Hannah Edwards is a 69 y.o. female with medical history significant for hypertension and obesity (BMI 47), now presenting to the emergency department with approximately 3 days of fevers, chills, aches, and shortness of breath after a sick contact at work. [name] denies cough, chest pain, or leg swelling or tenderness. She also denies nausea, vomiting, or diarrhea but has been experiencing mainly chills and then began to feel short of breath shortly prior to going to the ED. She denies underlying heart or lung disease.   [facility] ED Course: Upon arrival to the ED, patient is found to be febrile to 39 C, saturating 88% on room air, tachypneic in the upper 20s, tachycardic in the 120s, and with stable blood pressure.  EKG features sinus tachycardia with PVCs and chest x-rays negative for acute cardiopulmonary disease.  Chemistry panel and CBC are unremarkable, procalcitonin undetectable, Covid antigen positive, and fibrin derivatives 887.  Lactic acid reassuringly normal.  Patient was started on supplemental oxygen, treated with Decadron and remdesivir, admitted to the hospitalist service, and transferred to [facility] for ongoing evaluation and management.  Review of Systems:  All other systems reviewed and apart from HPI, are negative.  History reviewed. No pertinent past medical history.  Past Surgical History:  Procedure Laterality Date  . ABDOMINAL HYSTERECTOMY    . WRIST SURGERY Left      reports that she quit smoking about 44 years ago. Her smoking use included cigarettes. She has never used smokeless tobacco. She reports that she does not drink alcohol or use drugs.  Allergies  Allergen Reactions  . Ace Inhibitors Swelling  . Atorvastatin Other (See Comments)    Body aches  .  Lisinopril     Family History  Problem Relation Age of Onset  . Hypertension Mother   . Cancer Mother        unknown type   . Hypertension Father   . Aneurysm Father   . Stomach cancer Sister   . Diabetes Brother   . HIV/AIDS Brother   . Breast cancer Neg Hx   . Lung cancer Neg Hx      Prior to Admission medications   Medication Sig Start Date End Date Taking? Authorizing Provider  acetaminophen (TYLENOL) 500 MG tablet Take 1,000 mg by mouth every 6 (six) hours as needed.    [provider]  carvedilol (COREG) 12.5 MG tablet Take 1 tablet (12.5 mg total) by mouth 2 (two) times daily with a meal. 11/16/18   Volney American, PA-C  omeprazole (PRILOSEC) 20 MG capsule Take 1 capsule (20 mg total) by mouth daily. 11/16/18   Volney American, PA-C    Physical Exam: There were no vitals filed for this visit.   Constitutional: NAD, calm  Eyes: PERTLA, lids and conjunctivae normal ENMT: Mucous membranes are moist. Posterior pharynx clear of any exudate or lesions.   Neck: normal, supple, no masses, no thyromegaly Respiratory: no wheezing, no crackles. No accessory muscle use.  Cardiovascular: S1 & S2 heard, regular rate and rhythm. No extremity edema.   Abdomen: No distension, no tenderness, soft. Bowel sounds active.  Musculoskeletal: no clubbing / cyanosis. No joint deformity upper and lower extremities.   Skin: no significant rashes, lesions, ulcers. Warm, dry, well-perfused. Neurologic: No facial asymmetry. Sensation  intact. Moving all extremities.  Psychiatric: Alert and oriented, appropriate throughout interview and exam. Very pleasant and cooperative.    Labs and Imaging on Admission: I have personally reviewed following labs and imaging studies  CBC: Recent Labs  Lab 06/29/19 2219  WBC 6.9  HGB 13.2  HCT 41.1  MCV 90.3  PLT 291   Basic Metabolic Panel: Recent Labs  Lab 06/29/19 2219  NA 137  K 3.7  CL 101  CO2 25  GLUCOSE 113*  BUN 11    CREATININE 0.95  CALCIUM 8.8*   GFR: Estimated Creatinine Clearance: 73.9 mL/min (by C-G formula based on SCr of 0.95 mg/dL). Liver Function Tests: Recent Labs  Lab 06/29/19 2219  AST 25  ALT 16  ALKPHOS 55  BILITOT 0.8  PROT 7.7  ALBUMIN 3.9   No results for input(s): LIPASE, AMYLASE in the last 168 hours. No results for input(s): AMMONIA in the last 168 hours. Coagulation Profile: No results for input(s): INR, PROTIME in the last 168 hours. Cardiac Enzymes: No results for input(s): CKTOTAL, CKMB, CKMBINDEX, TROPONINI in the last 168 hours. BNP (last 3 results) No results for input(s): PROBNP in the last 8760 hours. HbA1C: No results for input(s): HGBA1C in the last 72 hours. CBG: No results for input(s): GLUCAP in the last 168 hours. Lipid Profile: No results for input(s): CHOL, HDL, LDLCALC, TRIG, CHOLHDL, LDLDIRECT in the last 72 hours. Thyroid Function Tests: No results for input(s): TSH, T4TOTAL, FREET4, T3FREE, THYROIDAB in the last 72 hours. Anemia Panel: Recent Labs    06/30/19 0054  FERRITIN 136   Urine analysis: No results found for: COLORURINE, APPEARANCEUR, LABSPEC, PHURINE, GLUCOSEU, HGBUR, BILIRUBINUR, KETONESUR, PROTEINUR, UROBILINOGEN, NITRITE, LEUKOCYTESUR Sepsis Labs: @LABRCNTIP (procalcitonin:4,lacticidven:4) )No results found for this or any previous visit (from the past 240 hour(s)).   Radiological Exams on Admission: DG Chest Portable 1 View  Result Date: 06/29/2019 CLINICAL DATA:  Dyspnea EXAM: PORTABLE CHEST 1 VIEW COMPARISON:  12/11/2016 FINDINGS: The heart size and mediastinal contours are within normal limits. Both lungs are clear. The visualized skeletal structures are unremarkable. IMPRESSION: No active disease. Electronically Signed   By: 02/10/2017 M.D.   On: 06/29/2019 22:50    EKG: Independently reviewed. Sinus tachycardia, rate 123, PVC's.   Assessment/Plan   1. COVID-19  - Presents with ~3 days of fever/chills, aches, and  SOB, found to have COVID-19 with clear CXR but saturation 88% on rm air while at rest  - Procalcitonin was undetectable in ED and fibrin derivatives 887 - She was started on remdesivir, Decadron, and supplemental O2 the evening of 06/29/19  - Continue Decadron, remdesivir, and as-needed supplemental O2, check CRP, trend markers    2. Hypertension  - BP at goal, continue Coreg     DVT prophylaxis: Lovenox  Code Status: Full  Family Communication: Discussed with patient  Disposition Plan: Will likely return home once respiratory status improved and stable  Consults called: None  Admission status: Inpatient     07/01/19, MD [facility] Pager: See [email or web address]  If 7AM-7PM, please contact the daytime attending [email or web address]  06/30/2019, 6:14 AM

## 2019-06-30 NOTE — Discharge Summary (Signed)
Date of admission: 2/24  Date of transfer: 2/25  Admission diagnosis: Please refer to dictated admission H&P  Discharge diagnoses: 1.  COVID-19 with SIRS and mild hypoxemia 2.  Hypertension 3.  Morbid obesity  Hospital course:  Hannah Edwards  is a 69 y.o. female with a known history of hypertension and obesity, who presented to the emergency room with Onset of fever and chills as well as body aches since Monday.  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  She has not had much appetite.  She admits to dyspnea and wheezing as well as rhinorrhea but denied cough.  No nausea vomiting or diarrhea or abdominal pain.  She denied loss of taste or smell.  No dysuria, oliguria or hematuria or flank pain.  Upon presentation to the emergency room, heart rate was 104 and temperature was initially normal later 102.2 with respiratory to 28 and heart rate 122 later on with a blood pressure of 140/92.  Pulse symmetry was 92 to 94% and on 2 L O2 by nasal cannula was up to 95 then 98%.  Labs revealed procalcitonin less than 0.1, unremarkable CBC and CMP and lactic acid 1.4.  COVID-19 antigen test came back positive.  Portable chest ray showed no acute cardiopulmonary disease with chronic changes similar to prior chest x-ray..EKG showed sinus tachycardia with a rate of 123 with PVCs and poor R wave progression.  The patient was given 1 g p.o. Tylenol and 600 mg of p.o. ibuprofen, 1 L bolus of IV normal saline and was started on IV remdesivir.  She will be admitted to medical monitored bed pending transfer to [facility].  1.  COVID-19.  This is manifested by SIRS and mild hypoxemia in the setting of morbid obesity though chest x-ray is not showing atypical pneumonia yet. -The patient will be admitted to the medical monitored bed. -She will be started on IV remdesivir. -Given mild hypoxemia we added IV Decadron pending inflammatory markers. -O2 protocol will be followed. -Contact  was made with [facility] and the patient was accepted for transfer. -We will place the patient on p.o. aspirin as well as vitamin D3.   -D-dimer came back elevated.  Will defer further work-up to the accepting physician.  2.  Hypertension. -We will continue Coreg.  3.  DVT prophylaxis. -Subcutaneous Lovenox.  Physical examination: GENERAL:  69 y.o.-year-old African-American obese patient lying in the bed with mild respiratory distress with conversational dyspnea.  EYES: Pupils equal, round, reactive to light and accommodation. No scleral icterus. Extraocular muscles intact.  HEENT: Head atraumatic, normocephalic. Oropharynx and nasopharynx clear.  NECK:  Supple, no jugular venous distention. No thyroid enlargement, no tenderness.  LUNGS: Left basal and midlung zone crackles. CARDIOVASCULAR: Regular rate and rhythm, S1, S2 normal. No murmurs, rubs, or gallops.  ABDOMEN: Soft, nondistended, nontender. Bowel sounds present. No organomegaly or mass.  EXTREMITIES: No pedal edema, cyanosis, or clubbing.  NEUROLOGIC: Cranial nerves II through XII are intact. Muscle strength 5/5 in all extremities. Sensation intact. Gait not checked.  PSYCHIATRIC: The patient is alert and oriented x 3.  Normal affect and good eye contact. SKIN: No obvious rash, lesion, or ulcer.  Labs: Please refer to initial H&P.  LDH came back 151 and ferritin 136 and fibrin derivatives D-dimer 887.06.  CRP is pending  Transfer medications: As per MAR.  Accepting facility, [facility]  Accepting physician: Mitzi Hansen, MD / [facility].  Transfer condition: Stable

## 2019-07-01 LAB — MAGNESIUM: Magnesium: 2.2 mg/dL (ref 1.7–2.4)

## 2019-07-01 LAB — CBC WITH DIFFERENTIAL/PLATELET
Abs Immature Granulocytes: 0.03 10*3/uL (ref 0.00–0.07)
Basophils Absolute: 0 10*3/uL (ref 0.0–0.1)
Basophils Relative: 0 %
Eosinophils Absolute: 0 10*3/uL (ref 0.0–0.5)
Eosinophils Relative: 0 %
HCT: 40.5 % (ref 36.0–46.0)
Hemoglobin: 13 g/dL (ref 12.0–15.0)
Immature Granulocytes: 1 %
Lymphocytes Relative: 23 %
Lymphs Abs: 1.4 10*3/uL (ref 0.7–4.0)
MCH: 29.4 pg (ref 26.0–34.0)
MCHC: 32.1 g/dL (ref 30.0–36.0)
MCV: 91.6 fL (ref 80.0–100.0)
Monocytes Absolute: 0.3 10*3/uL (ref 0.1–1.0)
Monocytes Relative: 5 %
Neutro Abs: 4.2 10*3/uL (ref 1.7–7.7)
Neutrophils Relative %: 71 %
Platelets: 309 10*3/uL (ref 150–400)
RBC: 4.42 MIL/uL (ref 3.87–5.11)
RDW: 14.7 % (ref 11.5–15.5)
WBC: 5.9 10*3/uL (ref 4.0–10.5)
nRBC: 0 % (ref 0.0–0.2)

## 2019-07-01 LAB — COMPREHENSIVE METABOLIC PANEL WITH GFR
ALT: 16 U/L (ref 0–44)
AST: 23 U/L (ref 15–41)
Albumin: 3.4 g/dL — ABNORMAL LOW (ref 3.5–5.0)
Alkaline Phosphatase: 49 U/L (ref 38–126)
Anion gap: 7 (ref 5–15)
BUN: 15 mg/dL (ref 8–23)
CO2: 26 mmol/L (ref 22–32)
Calcium: 8.5 mg/dL — ABNORMAL LOW (ref 8.9–10.3)
Chloride: 106 mmol/L (ref 98–111)
Creatinine, Ser: 0.76 mg/dL (ref 0.44–1.00)
GFR calc Af Amer: >60 mL/min (ref >60)
GFR calc non Af Amer: >60 mL/min (ref >60)
Glucose, Bld: 145 mg/dL — ABNORMAL HIGH (ref 70–99)
Potassium: 4.6 mmol/L (ref 3.5–5.1)
Sodium: 139 mmol/L (ref 135–145)
Total Bilirubin: 0.6 mg/dL (ref 0.3–1.2)
Total Protein: 7.2 g/dL (ref 6.5–8.1)

## 2019-07-01 LAB — FERRITIN: Ferritin: 191 ng/mL (ref 11–307)

## 2019-07-01 LAB — D-DIMER, QUANTITATIVE: D-Dimer, Quant: 0.34 ug/mL-FEU (ref 0.00–0.50)

## 2019-07-01 LAB — HIV ANTIBODY (ROUTINE TESTING W REFLEX): HIV Screen 4th Generation wRfx: NONREACTIVE

## 2019-07-01 LAB — C-REACTIVE PROTEIN: CRP: 3.2 mg/dL — ABNORMAL HIGH (ref <1.0)

## 2019-07-01 NOTE — Evaluation (Signed)
Physical Therapy Evaluation Patient Details Name: Hannah Edwards MRN: [id number] DOB: December 29, 1950 Today's Date: 07/01/2019   History of Present Illness  69 year old with a history of HTN and morbid obesity who presented to the [facility] ED with 3 days of fevers, chills, body aches, and shortness of breath after a sick contact at work.  In the ED she was found to have a saturation of 88% on room air, was tachycardic, and a CXR was without evidence of a clear pulmonary infiltrate.  Her Covid test was positive.  Clinical Impression   Pt admitted with above diagnosis. PTA was living home alone in 2 story home, does not use second story as bedroom etc on ground floor. Pt states was every independent and did not own nor use and DME/AD. Pt currently with functional limitations due to the deficits listed below (see PT Problem List). This am pt did ver well in assessment, she was found on 1L/min via [place] (extended x 3) was placed on room air for session and managed to keep sats in 90s throughout. Pt was able to ambulate 26ft w/ stand by assist, and completed sit<>stand x 5 and BUE exercises with red thera-band. Pt was educated on importance ef and prescribed usage of flutter valve and also incentive spirometer. Pt will benefit from skilled PT to increase their independence and safety with mobility to allow discharge to the venue listed below.      Follow Up Recommendations No PT follow up    Equipment Recommendations  None recommended by PT    Recommendations for Other Services       Precautions / Restrictions Precautions Precautions: Fall Restrictions Weight Bearing Restrictions: No      Mobility  Bed Mobility               General bed mobility comments: up in chair  Transfers Overall transfer level: Needs assistance Equipment used: None Transfers: Sit to/from Stand;Stand Pivot Transfers Sit to Stand: Supervision Stand pivot transfers: Supervision           Ambulation/Gait Ambulation/Gait assistance: Supervision Gait Distance (Feet): 200 Feet Assistive device: None Gait Pattern/deviations: Step-through pattern Gait velocity: fair   General Gait Details: on room air throughout and able to maintain sats in mid to high 90s  Stairs            Wheelchair Mobility    Modified Rankin (Stroke Patients Only)       Balance Overall balance assessment: Mild deficits observed, not formally tested                                           Pertinent Vitals/Pain Pain Assessment: No/denies pain    Home Living Family/patient expects to be discharged to:: Private residence Living Arrangements: Alone Available Help at Discharge: Family Type of Home: House Home Access: Stairs to enter     Home Layout: Two level;Able to live on main level with bedroom/bathroom Home Equipment: None      Prior Function Level of Independence: Independent               Hand Dominance   Dominant Hand: Right    Extremity/Trunk Assessment   Upper Extremity Assessment Upper Extremity Assessment: Generalized weakness    Lower Extremity Assessment Lower Extremity Assessment: Generalized weakness    Cervical / Trunk Assessment Cervical / Trunk Assessment: Normal  Communication   Communication: No difficulties  Cognition Arousal/Alertness: Awake/alert Behavior During Therapy: WFL for tasks assessed/performed Overall Cognitive Status: Within Functional Limits for tasks assessed                                        General Comments General comments (skin integrity, edema, etc.): pt found on 1L/min via [place] (extended x3) was placed on room air for session and managed to keep sats in 90s     Exercises General Exercises - Upper Extremity Shoulder Flexion: AROM;Strengthening;Both;10 reps;Theraband Theraband Level (Shoulder Flexion): Level 2 (Red) Shoulder Horizontal ABduction: AROM;Strengthening;Both;10  reps;Theraband Theraband Level (Shoulder Horizontal Abduction): Level 2 (Red) Elbow Flexion: AROM;Strengthening;Both;10 reps;Theraband Theraband Level (Elbow Flexion): Level 2 (Red) Elbow Extension: AROM;Strengthening;Both;10 reps;Theraband Theraband Level (Elbow Extension): Level 2 (Red) Other Exercises Other Exercises: flutter valve x 5 Other Exercises: incentive spirometer x 5    Assessment/Plan    PT Assessment Patient needs continued PT services  PT Problem List Decreased activity tolerance;Decreased strength;Decreased mobility       PT Treatment Interventions Gait training;Stair training;Functional mobility training;Therapeutic activities;Therapeutic exercise;Balance training;Neuromuscular re-education    PT Goals (Current goals can be found in the Care Plan section)  Acute Rehab PT Goals Patient Stated Goal: To go home safe PT Goal Formulation: With patient Time For Goal Achievement: 07/15/19 Potential to Achieve Goals: Good    Frequency Min 3X/week   Barriers to discharge        Co-evaluation               AM-PAC PT "6 Clicks" Mobility  Outcome Measure Help needed turning from your back to your side while in a flat bed without using bedrails?: None Help needed moving from lying on your back to sitting on the side of a flat bed without using bedrails?: None Help needed moving to and from a bed to a chair (including a wheelchair)?: None Help needed standing up from a chair using your arms (e.g., wheelchair or bedside chair)?: None Help needed to walk in hospital room?: A Little Help needed climbing 3-5 steps with a railing? : A Lot 6 Click Score: 21    End of Session   Activity Tolerance: Patient tolerated treatment well Patient left: in chair;with call bell/phone within reach   PT Visit Diagnosis: Unsteadiness on feet (R26.81);Other abnormalities of gait and mobility (R26.89)    Time: 6967-8938 PT Time Calculation (min) (ACUTE ONLY): 25  min   Charges:   PT Evaluation $PT Eval Moderate Complexity: 1 Mod PT Treatments $Therapeutic Exercise: 8-22 mins        Horald Chestnut, PT   Delford Field 07/01/2019, 1:20 PM

## 2019-07-01 NOTE — Progress Notes (Signed)
Occupational Therapy Evaluation Patient Details Name: Hannah Edwards MRN: [id number] DOB: Sep 13, 1950 Today's Date: 07/01/2019    History of Present Illness 69 year old with a history of HTN and morbid obesity who presented to the [facility] ED with 3 days of fevers, chills, body aches, and shortness of breath after a sick contact at work.  In the ED she was found to have a saturation of 88% on room air, was tachycardic, and a CXR was without evidence of a clear pulmonary infiltrate.  Her Covid test was positive.   Clinical Impression   Patient lives alone in a 2 story home where she can reside on bottom floor.  She is independent without devices at prior level.  This session patient began on 1L O2 with two extensions, SpO2 96 at rest.  Patient completed mobility with supervision and standing LE ADLs with supervision. UE ADLs independently.  On standing patient felt a little dizzy.  BP checked multiple times and did not show any change out of normal range with position.  Patient slightly unsteady at start of walk but improved as she went on, stating she thought it was just because she had been sitting for a long time.  SpO2 maintain 91 on room air with mobility.  Patient was fatigued after walk but not short of breath. Will continue to see patient acutely for OT to improve safety and independence with ADLs and ensure safe discharge home.    Follow Up Recommendations  No OT follow up    Equipment Recommendations  None recommended by OT    Recommendations for Other Services       Precautions / Restrictions Precautions Precautions: Fall Restrictions Weight Bearing Restrictions: No      Mobility Bed Mobility               General bed mobility comments: up in chair  Transfers Overall transfer level: Needs assistance Equipment used: None Transfers: Sit to/from Stand Sit to Stand: Supervision              Balance Overall balance assessment: Mild deficits observed, not formally  tested(slightly wobbly at start of walking, steadied self)                                         ADL either performed or assessed with clinical judgement   ADL Overall ADL's : Needs assistance/impaired Eating/Feeding: Independent;Sitting   Grooming: Wash/dry hands;Wash/dry face;Oral care;Standing;Independent   Upper Body Bathing: Standing;Independent   Lower Body Bathing: Supervison/ safety;Sit to/from stand   Upper Body Dressing : Independent   Lower Body Dressing: Supervision/safety;Sit to/from stand   Toilet Transfer: Supervision/safety;Ambulation;Regular Toilet   Toileting- Architect and Hygiene: Independent;Sit to/from stand       Functional mobility during ADLs: Supervision/safety       Vision         Perception     Praxis      Pertinent Vitals/Pain Pain Assessment: No/denies pain     Hand Dominance Right   Extremity/Trunk Assessment Upper Extremity Assessment Upper Extremity Assessment: Generalized weakness           Communication Communication Communication: No difficulties   Cognition Arousal/Alertness: Awake/alert Behavior During Therapy: WFL for tasks assessed/performed Overall Cognitive Status: Within Functional Limits for tasks assessed  General Comments       Exercises Exercises: Other exercises Other Exercises Other Exercises: Walk 156ft   Shoulder Instructions      Home Living Family/patient expects to be discharged to:: Private residence Living Arrangements: Alone Available Help at Discharge: Family Type of Home: House Home Access: Stairs to enter     Home Layout: Two level;Able to live on main level with bedroom/bathroom     Bathroom Shower/Tub: Walk-in shower;Tub/shower unit   Bathroom Toilet: Standard     Home Equipment: None          Prior Functioning/Environment Level of Independence: Independent                 OT  Problem List: Decreased strength;Decreased activity tolerance;Impaired balance (sitting and/or standing);Cardiopulmonary status limiting activity      OT Treatment/Interventions: Self-care/ADL training;Therapeutic exercise;Therapeutic activities;Balance training;Patient/family education    OT Goals(Current goals can be found in the care plan section) Acute Rehab OT Goals Patient Stated Goal: To go home safe OT Goal Formulation: With patient Time For Goal Achievement: 07/15/19 Potential to Achieve Goals: Good  OT Frequency: Min 3X/week   Barriers to D/C:            Co-evaluation              AM-PAC OT "6 Clicks" Daily Activity     Outcome Measure Help from another person eating meals?: None Help from another person taking care of personal grooming?: None Help from another person toileting, which includes using toliet, bedpan, or urinal?: A Little Help from another person bathing (including washing, rinsing, drying)?: A Little Help from another person to put on and taking off regular upper body clothing?: None Help from another person to put on and taking off regular lower body clothing?: A Little 6 Click Score: 21   End of Session Equipment Utilized During Treatment: Oxygen Nurse Communication: Mobility status  Activity Tolerance: Patient tolerated treatment well Patient left: in chair;with call bell/phone within reach  OT Visit Diagnosis: Muscle weakness (generalized) (M62.81);Unsteadiness on feet (R26.81)                Time: 7782-4235 OT Time Calculation (min): 33 min Charges:  OT General Charges $OT Visit: 1 Visit OT Evaluation $OT Eval Moderate Complexity: 1 Mod OT Treatments $Self Care/Home Management : 8-22 mins  August Luz, OTR/L   Phylliss Bob 07/01/2019, 10:37 AM

## 2019-07-01 NOTE — Progress Notes (Signed)
Called and spoke with the patient's son Ladene Artist. Answered all of his questions and concerns at this time.

## 2019-07-01 NOTE — Plan of Care (Signed)
  Problem: Education: Goal: Knowledge of General Education information will improve Description: Including pain rating scale, medication(s)/side effects and non-pharmacologic comfort measures Outcome: Progressing   Problem: Health Behavior/Discharge Planning: Goal: Ability to manage health-related needs will improve Outcome: Progressing   Problem: Clinical Measurements: Goal: Ability to maintain clinical measurements within normal limits will improve Outcome: Progressing Goal: Will remain free from infection Outcome: Progressing Goal: Diagnostic test results will improve Outcome: Progressing Goal: Respiratory complications will improve Outcome: Progressing   Problem: Activity: Goal: Risk for activity intolerance will decrease Outcome: Progressing   Problem: Coping: Goal: Level of anxiety will decrease Outcome: Progressing   Problem: Elimination: Goal: Will not experience complications related to bowel motility Outcome: Progressing   

## 2019-07-01 NOTE — Plan of Care (Signed)

## 2019-07-01 NOTE — Progress Notes (Signed)
Hannah Edwards  TKW:[id number] DOB: Apr 26, 1951 DOA: 06/30/2019 PCP: Volney American, PA-C    Brief Narrative:  69 year old with a history of HTN and morbid obesity who presented to the [facility] ED with 3 days of fevers, chills, body aches, and shortness of breath after a sick contact at work.  In the ED she was found to have a saturation of 88% on room air, was tachycardic, and a CXR was without evidence of a clear pulmonary infiltrate.  Her Covid test was positive.  Significant Events: 2/24 COVID test + - admit to [facility] via [facility] ED  COVID-19 specific Treatment: Decadron 2/24 > Remdesivir 2/24 >  Antimicrobials:  None  Subjective: Requiring only 1L [place] support at this time.  Feels that she is slowly improving overall feeling weak.  Poor appetite remains.  No nausea diarrhea.  Assessment & Plan:  COVID Pneumonia - acute hypoxic respiratory failure Continue Decadron and remdesivir - cont to mobilize - incentive spirometer - complete course of Remdesivir - consider early discontinuation of decadron if continues to improve   Recent Labs  Lab 06/29/19 2219 06/30/19 0054 07/01/19 0426  DDIMER  --   --  0.34  FERRITIN  --  136 191  CRP  --  4.2* 3.2*  ALT 16  --  16  PROCALCITON <0.10  --   --     HTN Blood pressure presently well controlled  Obesity - Body mass index is 47.03 kg/m.   DVT prophylaxis: Lovenox Code Status: FULL CODE Family Communication:  Disposition Plan: Anticipate discharge home in 2-3 days  Consultants:  none  Objective: Blood pressure 124/79, pulse 69, temperature 98.2 F (36.8 C), temperature source Oral, resp. rate 16, height 5\' 5"  (1.651 m), weight 128.2 kg, SpO2 95 %.  Intake/Output Summary (Last 24 hours) at 07/01/2019 0828 Last data filed at 07/01/2019 0323 Gross per 24 hour  Intake 1148.82 ml  Output --  Net 1148.82 ml   Filed Weights   06/30/19 0653  Weight: 128.2 kg    Examination: General: No acute respiratory distress Lungs:  Clear to auscultation bilaterally - no wheeze Cardiovascular: RRR Abdomen: overweight, soft, BS+ Extremities: No signif edema bilateral lower extremities  CBC: Recent Labs  Lab 06/29/19 2219 07/01/19 0426  WBC 6.9 5.9  NEUTROABS  --  4.2  HGB 13.2 13.0  HCT 41.1 40.5  MCV 90.3 91.6  PLT 291 924   Basic Metabolic Panel: Recent Labs  Lab 06/29/19 2219 07/01/19 0426  NA 137 139  K 3.7 4.6  CL 101 106  CO2 25 26  GLUCOSE 113* 145*  BUN 11 15  CREATININE 0.95 0.76  CALCIUM 8.8* 8.5*  MG  --  2.2   GFR: Estimated Creatinine Clearance: 88.3 mL/min (by C-G formula based on SCr of 0.76 mg/dL).  Liver Function Tests: Recent Labs  Lab 06/29/19 2219 07/01/19 0426  AST 25 23  ALT 16 16  ALKPHOS 55 49  BILITOT 0.8 0.6  PROT 7.7 7.2  ALBUMIN 3.9 3.4*    HbA1C: HB A1C (BAYER DCA - WAIVED)  Date/Time Value Ref Range Status  05/11/2015 10:01 AM 6.3 <7.0 % Final    Comment:                                          Diabetic Adult            <7.0  Healthy Adult        4.3 - 5.7                                                           (DCCT/NGSP) American Diabetes Association's Summary of Glycemic Recommendations for Adults with Diabetes: Hemoglobin A1c <7.0%. More stringent glycemic goals (A1c <6.0%) may further reduce complications at the cost of increased risk of hypoglycemia.    Hgb A1c MFr Bld  Date/Time Value Ref Range Status  02/08/2019 02:51 PM 6.2 (H) 4.8 - 5.6 % Final    Comment:             Prediabetes: 5.7 - 6.4          Diabetes: >6.4          Glycemic control for adults with diabetes: <7.0   05/17/2018 01:23 PM 6.1 (H) 4.8 - 5.6 % Final    Comment:             Prediabetes: 5.7 - 6.4          Diabetes: >6.4          Glycemic control for adults with diabetes: <7.0     CBG: No results for input(s): GLUCAP in the last 168 hours.  No results found for this or any previous visit (from the past 240 hour(s)).    Scheduled Meds: . carvedilol  12.5 mg Oral BID WC  . dexamethasone (DECADRON) injection  6 mg Intravenous Q24H  . enoxaparin (LOVENOX) injection  60 mg Subcutaneous Q24H  . pantoprazole  40 mg Oral Daily  . sodium chloride flush  3 mL Intravenous Q12H   Continuous Infusions: . remdesivir 100 mg in NS 100 mL 100 mg (06/30/19 1658)     LOS: 1 day   Lonia Blood, MD [facility] Office  [phone] Pager - Text Page per Loretha Stapler  If 7PM-7AM, please contact night-coverage per Amion 07/01/2019, 8:28 AM

## 2019-07-02 LAB — CBC WITH DIFFERENTIAL/PLATELET
Abs Immature Granulocytes: 0.03 10*3/uL (ref 0.00–0.07)
Basophils Absolute: 0 10*3/uL (ref 0.0–0.1)
Basophils Relative: 0 %
Eosinophils Absolute: 0 10*3/uL (ref 0.0–0.5)
Eosinophils Relative: 0 %
HCT: 40.6 % (ref 36.0–46.0)
Hemoglobin: 12.9 g/dL (ref 12.0–15.0)
Immature Granulocytes: 1 %
Lymphocytes Relative: 22 %
Lymphs Abs: 1.3 10*3/uL (ref 0.7–4.0)
MCH: 29.1 pg (ref 26.0–34.0)
MCHC: 31.8 g/dL (ref 30.0–36.0)
MCV: 91.6 fL (ref 80.0–100.0)
Monocytes Absolute: 0.2 10*3/uL (ref 0.1–1.0)
Monocytes Relative: 3 %
Neutro Abs: 4.5 10*3/uL (ref 1.7–7.7)
Neutrophils Relative %: 74 %
Platelets: 332 10*3/uL (ref 150–400)
RBC: 4.43 MIL/uL (ref 3.87–5.11)
RDW: 14.7 % (ref 11.5–15.5)
WBC: 6 10*3/uL (ref 4.0–10.5)
nRBC: 0 % (ref 0.0–0.2)

## 2019-07-02 LAB — COMPREHENSIVE METABOLIC PANEL
ALT: 21 U/L (ref 0–44)
AST: 21 U/L (ref 15–41)
Albumin: 3.4 g/dL — ABNORMAL LOW (ref 3.5–5.0)
Alkaline Phosphatase: 48 U/L (ref 38–126)
Anion gap: 8 (ref 5–15)
BUN: 18 mg/dL (ref 8–23)
CO2: 25 mmol/L (ref 22–32)
Calcium: 8.8 mg/dL — ABNORMAL LOW (ref 8.9–10.3)
Chloride: 107 mmol/L (ref 98–111)
Creatinine, Ser: 0.72 mg/dL (ref 0.44–1.00)
GFR calc Af Amer: >60 mL/min (ref >60)
GFR calc non Af Amer: >60 mL/min (ref >60)
Glucose, Bld: 168 mg/dL — ABNORMAL HIGH (ref 70–99)
Potassium: 4.6 mmol/L (ref 3.5–5.1)
Sodium: 140 mmol/L (ref 135–145)
Total Bilirubin: 0.5 mg/dL (ref 0.3–1.2)
Total Protein: 7.2 g/dL (ref 6.5–8.1)

## 2019-07-02 LAB — FERRITIN: Ferritin: 200 ng/mL (ref 11–307)

## 2019-07-02 LAB — D-DIMER, QUANTITATIVE: D-Dimer, Quant: 0.29 ug/mL-FEU (ref 0.00–0.50)

## 2019-07-02 LAB — C-REACTIVE PROTEIN: CRP: 1.4 mg/dL — ABNORMAL HIGH (ref <1.0)

## 2019-07-02 MED ORDER — DEXAMETHASONE 6 MG PO TABS
6.0000 mg | ORAL_TABLET | Freq: Every day | ORAL | Status: DC
  Administered 2019-07-03: 6 mg via ORAL
  Filled 2019-07-02: qty 1

## 2019-07-02 NOTE — Progress Notes (Signed)
Hannah Edwards  RWE:[id number] DOB: 05/29/1950 DOA: 06/30/2019 PCP: Particia Nearing, PA-C    Brief Narrative:  69 year old with a history of HTN and morbid obesity who presented to the [facility] ED with 3 days of fevers, chills, body aches, and shortness of breath after a sick contact at work.  In the ED she was found to have a saturation of 88% on room air, was tachycardic, and a CXR was without evidence of a clear pulmonary infiltrate.  Her Covid test was positive.  Significant Events: 2/24 COVID test + - admit to [facility]  COVID-19 specific Treatment: Decadron 2/24 > Remdesivir 2/24 > 2/28  Antimicrobials:  None  Subjective: Able to keep saturations at 90% or greater even when ambulating yesterday. No new complaints today. Is feeling much stronger. Anticpate d/c home 2/28 after her 5th dose of Remdesivir.   Assessment & Plan:  COVID Pneumonia - acute hypoxic respiratory failure Continue Decadron and remdesivir - cont to mobilize - incentive spirometer - complete course of Remdesivir - consider early discontinuation of decadron if continues to improve - probable d/c home 2/28  Recent Labs  Lab 06/29/19 2219 06/30/19 0054 07/01/19 0426 07/02/19 0431  DDIMER  --   --  0.34 0.29  FERRITIN  --  136 191 200  CRP  --  4.2* 3.2* 1.4*  ALT 16  --  16 21  PROCALCITON <0.10  --   --   --     HTN Blood pressure presently well controlled  Obesity - Body mass index is 47.03 kg/m.   DVT prophylaxis: Lovenox Code Status: FULL CODE Family Communication:  Disposition Plan: Anticipate discharge home 2/28  Consultants:  none  Objective: Blood pressure 120/66, pulse 61, temperature 98 F (36.7 C), temperature source Oral, resp. rate 16, height 5\' 5"  (1.651 m), weight 128.2 kg, SpO2 93 %.  Intake/Output Summary (Last 24 hours) at 07/02/2019 0859 Last data filed at 07/02/2019 0700 Gross per 24 hour  Intake 60 ml  Output --  Net 60 ml   Filed Weights   06/30/19 0653   Weight: 128.2 kg    Examination: General: No acute respiratory distress Lungs: Clear to auscultation B Cardiovascular: RRR Abdomen: overweight, soft, BS+ Extremities: No edema B LE   CBC: Recent Labs  Lab 06/29/19 2219 07/01/19 0426 07/02/19 0431  WBC 6.9 5.9 6.0  NEUTROABS  --  4.2 4.5  HGB 13.2 13.0 12.9  HCT 41.1 40.5 40.6  MCV 90.3 91.6 91.6  PLT 291 309 332   Basic Metabolic Panel: Recent Labs  Lab 06/29/19 2219 07/01/19 0426 07/02/19 0431  NA 137 139 140  K 3.7 4.6 4.6  CL 101 106 107  CO2 25 26 25   GLUCOSE 113* 145* 168*  BUN 11 15 18   CREATININE 0.95 0.76 0.72  CALCIUM 8.8* 8.5* 8.8*  MG  --  2.2  --    GFR: Estimated Creatinine Clearance: 88.3 mL/min (by C-G formula based on SCr of 0.72 mg/dL).  Liver Function Tests: Recent Labs  Lab 06/29/19 2219 07/01/19 0426 07/02/19 0431  AST 25 23 21   ALT 16 16 21   ALKPHOS 55 49 48  BILITOT 0.8 0.6 0.5  PROT 7.7 7.2 7.2  ALBUMIN 3.9 3.4* 3.4*    HbA1C: HB A1C (BAYER DCA - WAIVED)  Date/Time Value Ref Range Status  05/11/2015 10:01 AM 6.3 <7.0 % Final    Comment:  Diabetic Adult            <7.0                                       Healthy Adult        4.3 - 5.7                                                           (DCCT/NGSP) American Diabetes Association's Summary of Glycemic Recommendations for Adults with Diabetes: Hemoglobin A1c <7.0%. More stringent glycemic goals (A1c <6.0%) may further reduce complications at the cost of increased risk of hypoglycemia.    Hgb A1c MFr Bld  Date/Time Value Ref Range Status  02/08/2019 02:51 PM 6.2 (H) 4.8 - 5.6 % Final    Comment:             Prediabetes: 5.7 - 6.4          Diabetes: >6.4          Glycemic control for adults with diabetes: <7.0   05/17/2018 01:23 PM 6.1 (H) 4.8 - 5.6 % Final    Comment:             Prediabetes: 5.7 - 6.4          Diabetes: >6.4          Glycemic control for adults with  diabetes: <7.0     Scheduled Meds: . carvedilol  12.5 mg Oral BID WC  . dexamethasone (DECADRON) injection  6 mg Intravenous Q24H  . enoxaparin (LOVENOX) injection  60 mg Subcutaneous Q24H  . pantoprazole  40 mg Oral Daily  . sodium chloride flush  3 mL Intravenous Q12H   Continuous Infusions: . remdesivir 100 mg in NS 100 mL 100 mg (07/01/19 0901)     LOS: 2 days   Cherene Altes, MD [facility] Office  [phone] Pager - Text Page per Shea Evans  If 7PM-7AM, please contact night-coverage per Amion 07/02/2019, 8:59 AM

## 2019-07-03 NOTE — Discharge Instructions (Signed)
Date of Positive COVID Test: 06/29/19  Date Quarantine Ends: 07/20/19  During your hospital stay you were cared for by:  Dr. Jetty Duhamel [facility]  [phone]  COVID-19 COVID-19 is a respiratory infection that is caused by a virus called severe acute respiratory syndrome coronavirus 2 (SARS-CoV-2). The disease is also known as coronavirus disease or novel coronavirus. In some people, the virus may not cause any symptoms. In others, it may cause a serious infection. The infection can get worse quickly and can lead to complications, such as:  Pneumonia, or infection of the lungs.  Acute respiratory distress syndrome or ARDS. This is a condition in which fluid build-up in the lungs prevents the lungs from filling with air and passing oxygen into the blood.  Acute respiratory failure. This is a condition in which there is not enough oxygen passing from the lungs to the body or when carbon dioxide is not passing from the lungs out of the body.  Sepsis or septic shock. This is a serious bodily reaction to an infection.  Blood clotting problems.  Secondary infections due to bacteria or fungus.  Organ failure. This is when your body's organs stop working. The virus that causes COVID-19 is contagious. This means that it can spread from person to person through droplets from coughs and sneezes (respiratory secretions). What are the causes? This illness is caused by a virus. You may catch the virus by:  Breathing in droplets from an infected person. Droplets can be spread by a person breathing, speaking, singing, coughing, or sneezing.  Touching something, like a table or a doorknob, that was exposed to the virus (contaminated) and then touching your mouth, nose, or eyes. What increases the risk? Risk for infection You are more likely to be infected with this virus if you:  Are within 6 feet (2 meters) of a person with COVID-19.  Provide care for or live with a person who  is infected with COVID-19.  Spend time in crowded indoor spaces or live in shared housing. Risk for serious illness You are more likely to become seriously ill from the virus if you:  Are 1 years of age or older. The higher your age, the more you are at risk for serious illness.  Live in a nursing home or long-term care facility.  Have cancer.  Have a long-term (chronic) disease such as: ? Chronic lung disease, including chronic obstructive pulmonary disease or asthma. ? A long-term disease that lowers your body's ability to fight infection (immunocompromised). ? Heart disease, including heart failure, a condition in which the arteries that lead to the heart become narrow or blocked (coronary artery disease), a disease which makes the heart muscle thick, weak, or stiff (cardiomyopathy). ? Diabetes. ? Chronic kidney disease. ? Sickle cell disease, a condition in which red blood cells have an abnormal "sickle" shape. ? Liver disease.  Are obese. What are the signs or symptoms? Symptoms of this condition can range from mild to severe. Symptoms may appear any time from 2 to 14 days after being exposed to the virus. They include:  A fever or chills.  A cough.  Difficulty breathing.  Headaches, body aches, or muscle aches.  Runny or stuffy (congested) nose.  A sore throat.  New loss of taste or smell. Some people may also have stomach problems, such as nausea, vomiting, or diarrhea. Other people may not have any symptoms of COVID-19. How is this diagnosed? This condition may be diagnosed based on:  Your signs  and symptoms, especially if: ? You live in an area with a COVID-19 outbreak. ? You recently traveled to or from an area where the virus is common. ? You provide care for or live with a person who was diagnosed with COVID-19. ? You were exposed to a person who was diagnosed with COVID-19.  A physical exam.  Lab tests, which may include: ? Taking a sample of fluid  from the back of your nose and throat (nasopharyngeal fluid), your nose, or your throat using a swab. ? A sample of mucus from your lungs (sputum). ? Blood tests.  Imaging tests, which may include, X-rays, CT scan, or ultrasound. How is this treated? At present, there is no medicine to treat COVID-19. Medicines that treat other diseases are being used on a trial basis to see if they are effective against COVID-19. Your health care provider will talk with you about ways to treat your symptoms. For most people, the infection is mild and can be managed at home with rest, fluids, and over-the-counter medicines. Treatment for a serious infection usually takes places in a hospital intensive care unit (ICU). It may include one or more of the following treatments. These treatments are given until your symptoms improve.  Receiving fluids and medicines through an IV.  Supplemental oxygen. Extra oxygen is given through a tube in the nose, a face mask, or a hood.  Positioning you to lie on your stomach (prone position). This makes it easier for oxygen to get into the lungs.  Continuous positive airway pressure (CPAP) or bi-level positive airway pressure (BPAP) machine. This treatment uses mild air pressure to keep the airways open. A tube that is connected to a motor delivers oxygen to the body.  Ventilator. This treatment moves air into and out of the lungs by using a tube that is placed in your windpipe.  Tracheostomy. This is a procedure to create a hole in the neck so that a breathing tube can be inserted.  Extracorporeal membrane oxygenation (ECMO). This procedure gives the lungs a chance to recover by taking over the functions of the heart and lungs. It supplies oxygen to the body and removes carbon dioxide. Follow these instructions at home: Lifestyle  If you are sick, stay home except to get medical care. Your health care provider will tell you how long to stay home. Call your health care  provider before you go for medical care.  Rest at home as told by your health care provider.  Do not use any products that contain nicotine or tobacco, such as cigarettes, e-cigarettes, and chewing tobacco. If you need help quitting, ask your health care provider.  Return to your normal activities as told by your health care provider. Ask your health care provider what activities are safe for you. General instructions  Take over-the-counter and prescription medicines only as told by your health care provider.  Drink enough fluid to keep your urine pale yellow.  Keep all follow-up visits as told by your health care provider. This is important. How is this prevented?  There is no vaccine to help prevent COVID-19 infection. However, there are steps you can take to protect yourself and others from this virus. To protect yourself:   Do not travel to areas where COVID-19 is a risk. The areas where COVID-19 is reported change often. To identify high-risk areas and travel restrictions, check the CDC travel website: StageSync.si  If you live in, or must travel to, an area where COVID-19 is a  risk, take precautions to avoid infection. ? Stay away from people who are sick. ? Wash your hands often with soap and water for 20 seconds. If soap and water are not available, use an alcohol-based hand sanitizer. ? Avoid touching your mouth, face, eyes, or nose. ? Avoid going out in public, follow guidance from your state and local health authorities. ? If you must go out in public, wear a cloth face covering or face mask. Make sure your mask covers your nose and mouth. ? Avoid crowded indoor spaces. Stay at least 6 feet (2 meters) away from others. ? Disinfect objects and surfaces that are frequently touched every day. This may include:  Counters and tables.  Doorknobs and light switches.  Sinks and faucets.  Electronics, such as phones, remote controls, keyboards, computers, and  tablets. To protect others: If you have symptoms of COVID-19, take steps to prevent the virus from spreading to others.  If you think you have a COVID-19 infection, contact your health care provider right away. Tell your health care team that you think you may have a COVID-19 infection.  Stay home. Leave your house only to seek medical care. Do not use public transport.  Do not travel while you are sick.  Wash your hands often with soap and water for 20 seconds. If soap and water are not available, use alcohol-based hand sanitizer.  Stay away from other members of your household. Let healthy household members care for children and pets, if possible. If you have to care for children or pets, wash your hands often and wear a mask. If possible, stay in your own room, separate from others. Use a different bathroom.  Make sure that all people in your household wash their hands well and often.  Cough or sneeze into a tissue or your sleeve or elbow. Do not cough or sneeze into your hand or into the air.  Wear a cloth face covering or face mask. Make sure your mask covers your nose and mouth. Where to find more information  Centers for Disease Control and Prevention: StickerEmporium.tn  World Health Organization: https://thompson-craig.com/ Contact a health care provider if:  You live in or have traveled to an area where COVID-19 is a risk and you have symptoms of the infection.  You have had contact with someone who has COVID-19 and you have symptoms of the infection. Get help right away if:  You have trouble breathing.  You have pain or pressure in your chest.  You have confusion.  You have bluish lips and fingernails.  You have difficulty waking from sleep.  You have symptoms that get worse. These symptoms may represent a serious problem that is an emergency. Do not wait to see if the symptoms will go away. Get medical help right away. Call  your local emergency services (911 in the [place] Do not drive yourself to the hospital. Let the emergency medical personnel know if you think you have COVID-19. Summary  COVID-19 is a respiratory infection that is caused by a virus. It is also known as coronavirus disease or novel coronavirus. It can cause serious infections, such as pneumonia, acute respiratory distress syndrome, acute respiratory failure, or sepsis.  The virus that causes COVID-19 is contagious. This means that it can spread from person to person through droplets from breathing, speaking, singing, coughing, or sneezing.  You are more likely to develop a serious illness if you are 62 years of age or older, have a weak immune system, live  in a nursing home, or have chronic disease.  There is no medicine to treat COVID-19. Your health care provider will talk with you about ways to treat your symptoms.  Take steps to protect yourself and others from infection. Wash your hands often and disinfect objects and surfaces that are frequently touched every day. Stay away from people who are sick and wear a mask if you are sick. This information is not intended to replace advice given to you by your health care provider. Make sure you discuss any questions you have with your health care provider. Document Revised: 02/18/2019 Document Reviewed: 05/27/2018 Elsevier Patient Education  2020 [employer].    COVID-19 COVID-19 is a respiratory infection that is caused by a virus called severe acute respiratory syndrome coronavirus 2 (SARS-CoV-2). The disease is also known as coronavirus disease or novel coronavirus. In some people, the virus may not cause any symptoms. In others, it may cause a serious infection. The infection can get worse quickly and can lead to complications, such as:  Pneumonia, or infection of the lungs.  Acute respiratory distress syndrome or ARDS. This is a condition in which fluid build-up in the lungs prevents the  lungs from filling with air and passing oxygen into the blood.  Acute respiratory failure. This is a condition in which there is not enough oxygen passing from the lungs to the body or when carbon dioxide is not passing from the lungs out of the body.  Sepsis or septic shock. This is a serious bodily reaction to an infection.  Blood clotting problems.  Secondary infections due to bacteria or fungus.  Organ failure. This is when your body's organs stop working. The virus that causes COVID-19 is contagious. This means that it can spread from person to person through droplets from coughs and sneezes (respiratory secretions). What are the causes? This illness is caused by a virus. You may catch the virus by:  Breathing in droplets from an infected person. Droplets can be spread by a person breathing, speaking, singing, coughing, or sneezing.  Touching something, like a table or a doorknob, that was exposed to the virus (contaminated) and then touching your mouth, nose, or eyes. What increases the risk? Risk for infection You are more likely to be infected with this virus if you:  Are within 6 feet (2 meters) of a person with COVID-19.  Provide care for or live with a person who is infected with COVID-19.  Spend time in crowded indoor spaces or live in shared housing. Risk for serious illness You are more likely to become seriously ill from the virus if you:  Are 70 years of age or older. The higher your age, the more you are at risk for serious illness.  Live in a nursing home or long-term care facility.  Have cancer.  Have a long-term (chronic) disease such as: ? Chronic lung disease, including chronic obstructive pulmonary disease or asthma. ? A long-term disease that lowers your body's ability to fight infection (immunocompromised). ? Heart disease, including heart failure, a condition in which the arteries that lead to the heart become narrow or blocked (coronary artery  disease), a disease which makes the heart muscle thick, weak, or stiff (cardiomyopathy). ? Diabetes. ? Chronic kidney disease. ? Sickle cell disease, a condition in which red blood cells have an abnormal "sickle" shape. ? Liver disease.  Are obese. What are the signs or symptoms? Symptoms of this condition can range from mild to severe. Symptoms may appear  any time from 2 to 14 days after being exposed to the virus. They include:  A fever or chills.  A cough.  Difficulty breathing.  Headaches, body aches, or muscle aches.  Runny or stuffy (congested) nose.  A sore throat.  New loss of taste or smell. Some people may also have stomach problems, such as nausea, vomiting, or diarrhea. Other people may not have any symptoms of COVID-19. How is this diagnosed? This condition may be diagnosed based on:  Your signs and symptoms, especially if: ? You live in an area with a COVID-19 outbreak. ? You recently traveled to or from an area where the virus is common. ? You provide care for or live with a person who was diagnosed with COVID-19. ? You were exposed to a person who was diagnosed with COVID-19.  A physical exam.  Lab tests, which may include: ? Taking a sample of fluid from the back of your nose and throat (nasopharyngeal fluid), your nose, or your throat using a swab. ? A sample of mucus from your lungs (sputum). ? Blood tests.  Imaging tests, which may include, X-rays, CT scan, or ultrasound. How is this treated? At present, there is no medicine to treat COVID-19. Medicines that treat other diseases are being used on a trial basis to see if they are effective against COVID-19. Your health care provider will talk with you about ways to treat your symptoms. For most people, the infection is mild and can be managed at home with rest, fluids, and over-the-counter medicines. Treatment for a serious infection usually takes places in a hospital intensive care unit (ICU). It may  include one or more of the following treatments. These treatments are given until your symptoms improve.  Receiving fluids and medicines through an IV.  Supplemental oxygen. Extra oxygen is given through a tube in the nose, a face mask, or a hood.  Positioning you to lie on your stomach (prone position). This makes it easier for oxygen to get into the lungs.  Continuous positive airway pressure (CPAP) or bi-level positive airway pressure (BPAP) machine. This treatment uses mild air pressure to keep the airways open. A tube that is connected to a motor delivers oxygen to the body.  Ventilator. This treatment moves air into and out of the lungs by using a tube that is placed in your windpipe.  Tracheostomy. This is a procedure to create a hole in the neck so that a breathing tube can be inserted.  Extracorporeal membrane oxygenation (ECMO). This procedure gives the lungs a chance to recover by taking over the functions of the heart and lungs. It supplies oxygen to the body and removes carbon dioxide. Follow these instructions at home: Lifestyle  If you are sick, stay home except to get medical care. Your health care provider will tell you how long to stay home. Call your health care provider before you go for medical care.  Rest at home as told by your health care provider.  Do not use any products that contain nicotine or tobacco, such as cigarettes, e-cigarettes, and chewing tobacco. If you need help quitting, ask your health care provider.  Return to your normal activities as told by your health care provider. Ask your health care provider what activities are safe for you. General instructions  Take over-the-counter and prescription medicines only as told by your health care provider.  Drink enough fluid to keep your urine pale yellow.  Keep all follow-up visits as told by your health  care provider. This is important. How is this prevented?  There is no vaccine to help prevent  COVID-19 infection. However, there are steps you can take to protect yourself and others from this virus. To protect yourself:   Do not travel to areas where COVID-19 is a risk. The areas where COVID-19 is reported change often. To identify high-risk areas and travel restrictions, check the CDC travel website: StageSync.si  If you live in, or must travel to, an area where COVID-19 is a risk, take precautions to avoid infection. ? Stay away from people who are sick. ? Wash your hands often with soap and water for 20 seconds. If soap and water are not available, use an alcohol-based hand sanitizer. ? Avoid touching your mouth, face, eyes, or nose. ? Avoid going out in public, follow guidance from your state and local health authorities. ? If you must go out in public, wear a cloth face covering or face mask. Make sure your mask covers your nose and mouth. ? Avoid crowded indoor spaces. Stay at least 6 feet (2 meters) away from others. ? Disinfect objects and surfaces that are frequently touched every day. This may include:  Counters and tables.  Doorknobs and light switches.  Sinks and faucets.  Electronics, such as phones, remote controls, keyboards, computers, and tablets. To protect others: If you have symptoms of COVID-19, take steps to prevent the virus from spreading to others.  If you think you have a COVID-19 infection, contact your health care provider right away. Tell your health care team that you think you may have a COVID-19 infection.  Stay home. Leave your house only to seek medical care. Do not use public transport.  Do not travel while you are sick.  Wash your hands often with soap and water for 20 seconds. If soap and water are not available, use alcohol-based hand sanitizer.  Stay away from other members of your household. Let healthy household members care for children and pets, if possible. If you have to care for children or pets, wash your hands  often and wear a mask. If possible, stay in your own room, separate from others. Use a different bathroom.  Make sure that all people in your household wash their hands well and often.  Cough or sneeze into a tissue or your sleeve or elbow. Do not cough or sneeze into your hand or into the air.  Wear a cloth face covering or face mask. Make sure your mask covers your nose and mouth. Where to find more information  Centers for Disease Control and Prevention: StickerEmporium.tn  World Health Organization: https://thompson-craig.com/ Contact a health care provider if:  You live in or have traveled to an area where COVID-19 is a risk and you have symptoms of the infection.  You have had contact with someone who has COVID-19 and you have symptoms of the infection. Get help right away if:  You have trouble breathing.  You have pain or pressure in your chest.  You have confusion.  You have bluish lips and fingernails.  You have difficulty waking from sleep.  You have symptoms that get worse. These symptoms may represent a serious problem that is an emergency. Do not wait to see if the symptoms will go away. Get medical help right away. Call your local emergency services (911 in the [place] Do not drive yourself to the hospital. Let the emergency medical personnel know if you think you have COVID-19. Summary  COVID-19 is a respiratory infection  that is caused by a virus. It is also known as coronavirus disease or novel coronavirus. It can cause serious infections, such as pneumonia, acute respiratory distress syndrome, acute respiratory failure, or sepsis.  The virus that causes COVID-19 is contagious. This means that it can spread from person to person through droplets from breathing, speaking, singing, coughing, or sneezing.  You are more likely to develop a serious illness if you are 70 years of age or older, have a weak immune system, live in a  nursing home, or have chronic disease.  There is no medicine to treat COVID-19. Your health care provider will talk with you about ways to treat your symptoms.  Take steps to protect yourself and others from infection. Wash your hands often and disinfect objects and surfaces that are frequently touched every day. Stay away from people who are sick and wear a mask if you are sick. This information is not intended to replace advice given to you by your health care provider. Make sure you discuss any questions you have with your health care provider. Document Revised: 02/18/2019 Document Reviewed: 05/27/2018 Elsevier Patient Education  2020 [employer] Inc.  COVID-19: How to Protect Yourself and Others Know how it spreads  There is currently no vaccine to prevent coronavirus disease 2019 (COVID-19).  The best way to prevent illness is to avoid being exposed to this virus.  The virus is thought to spread mainly from person-to-person. ? Between people who are in close contact with one another (within about 6 feet). ? Through respiratory droplets produced when an infected person coughs, sneezes or talks. ? These droplets can land in the mouths or noses of people who are nearby or possibly be inhaled into the lungs. ? COVID-19 may be spread by people who are not showing symptoms. Everyone should Clean your hands often  Wash your hands often with soap and water for at least 20 seconds especially after you have been in a public place, or after blowing your nose, coughing, or sneezing.  If soap and water are not readily available, use a hand sanitizer that contains at least 60% alcohol. Cover all surfaces of your hands and rub them together until they feel dry.  Avoid touching your eyes, nose, and mouth with unwashed hands. Avoid close contact  Limit contact with others as much as possible.  Avoid close contact with people who are sick.  Put distance between yourself and other  people. ? Remember that some people without symptoms may be able to spread virus. ? This is especially important for people who are at higher risk of getting very [employer].itsick.www.cdc.gov/coronavirus/2019-ncov/need-extra-precautions/people-at-higher-risk.html Cover your mouth and nose with a mask when around others  You could spread COVID-19 to others even if you do not feel sick.  Everyone should wear a mask in public settings and when around people not living in their household, especially when social distancing is difficult to maintain. ? Masks should not be placed on young children under age 83, anyone who has trouble breathing, or is unconscious, incapacitated or otherwise unable to remove the mask without assistance.  The mask is meant to protect other people in case you are infected.  Do NOT use a facemask meant for a Research scientist (physical sciences)healthcare worker.  Continue to keep about 6 feet between yourself and others. The mask is not a substitute for social distancing. Cover coughs and sneezes  Always cover your mouth and nose with a tissue when you cough or sneeze or use the inside of your  elbow.  Throw used tissues in the trash.  Immediately wash your hands with soap and water for at least 20 seconds. If soap and water are not readily available, clean your hands with a hand sanitizer that contains at least 60% alcohol. Clean and disinfect  Clean AND disinfect frequently touched surfaces daily. This includes tables, doorknobs, light switches, countertops, handles, desks, phones, keyboards, toilets, faucets, and sinks. ktimeonline.com  If surfaces are dirty, clean them: Use detergent or soap and water prior to disinfection.  Then, use a household disinfectant. You can see a list of EPA-registered household disinfectants here. SouthAmericaFlowers.co.uk 01/05/2019 This information is not intended to replace advice given to you by your health care  provider. Make sure you discuss any questions you have with your health care provider. Document Revised: 01/13/2019 Document Reviewed: 11/11/2018 Elsevier Patient Education  2020 [employer].  COVID-19 Frequently Asked Questions COVID-19 (coronavirus disease) is an infection that is caused by a large family of viruses. Some viruses cause illness in people and others cause illness in animals like camels, cats, and bats. In some cases, the viruses that cause illness in animals can spread to humans. Where did the coronavirus come from? In December 2019, [place] told the [employer] [facility]) of several cases of lung disease (human respiratory illness). These cases were linked to an open seafood and livestock market in the city of [place]. The link to the seafood and livestock market suggests that the virus may have spread from animals to humans. However, since that first outbreak in December, the virus has also been shown to spread from person to person. What is the name of the disease and the virus? Disease name Early on, this disease was called novel coronavirus. This is because scientists determined that the disease was caused by a new (novel) respiratory virus. The World Health Organization [facility]) has now named the disease COVID-19, or coronavirus disease. Virus name The virus that causes the disease is called severe acute respiratory syndrome coronavirus 2 (SARS-CoV-2). More information on disease and virus naming World Health Organization [facility]): www.who.int/emergencies/diseases/novel-coronavirus-2019/technical-guidance/naming-the-coronavirus-disease-(covid-2019)-and-the-virus-that-causes-it Who is at risk for complications from coronavirus disease? Some people may be at higher risk for complications from coronavirus disease. This includes older adults and people who have chronic diseases, such as heart disease, diabetes, and lung disease. If you are at higher risk for complications, take  these extra precautions:  Stay home as much as possible.  Avoid social gatherings and travel.  Avoid close contact with others. Stay at least 6 ft (2 m) away from others, if possible.  Wash your hands often with soap and water for at least 20 seconds.  Avoid touching your face, mouth, nose, or eyes.  Keep supplies on hand at home, such as food, medicine, and cleaning supplies.  If you must go out in public, wear a cloth face covering or face mask. Make sure your mask covers your nose and mouth. How does coronavirus disease spread? The virus that causes coronavirus disease spreads easily from person to person (is contagious). You may catch the virus by:  Breathing in droplets from an infected person. Droplets can be spread by a person breathing, speaking, singing, coughing, or sneezing.  Touching something, like a table or a doorknob, that was exposed to the virus (contaminated) and then touching your mouth, nose, or eyes. Can I get the virus from touching surfaces or objects? There is still a lot that we do not know about the virus that causes coronavirus disease. Scientists are  basing a lot of information on what they know about similar viruses, such as:  Viruses cannot generally survive on surfaces for long. They need a human body (host) to survive.  It is more likely that the virus is spread by close contact with people who are sick (direct contact), such as through: ? Shaking hands or hugging. ? Breathing in respiratory droplets that travel through the air. Droplets can be spread by a person breathing, speaking, singing, coughing, or sneezing.  It is less likely that the virus is spread when a person touches a surface or object that has the virus on it (indirect contact). The virus may be able to enter the body if the person touches a surface or object and then touches his or her face, eyes, nose, or mouth. Can a person spread the virus without having symptoms of the disease? It  may be possible for the virus to spread before a person has symptoms of the disease, but this is most likely not the main way the virus is spreading. It is more likely for the virus to spread by being in close contact with people who are sick and breathing in the respiratory droplets spread by a person breathing, speaking, singing, coughing, or sneezing. What are the symptoms of coronavirus disease? Symptoms vary from person to person and can range from mild to severe. Symptoms may include:  Fever or chills.  Cough.  Difficulty breathing or feeling short of breath.  Headaches, body aches, or muscle aches.  Runny or stuffy (congested) nose.  Sore throat.  New loss of taste or smell.  Nausea, vomiting, or diarrhea. These symptoms can appear anywhere from 2 to 14 days after you have been exposed to the virus. Some people may not have any symptoms. If you develop symptoms, call your health care provider. People with severe symptoms may need hospital care. Should I be tested for this virus? Your health care provider will decide whether to test you based on your symptoms, history of exposure, and your risk factors. How does a health care provider test for this virus? Health care providers will collect samples to send for testing. Samples may include:  Taking a swab of fluid from the back of your nose and throat, your nose, or your throat.  Taking fluid from the lungs by having you cough up mucus (sputum) into a sterile cup.  Taking a blood sample. Is there a treatment or vaccine for this virus? Currently, there is no vaccine to prevent coronavirus disease. Also, there are no medicines like antibiotics or antivirals to treat the virus. A person who becomes sick is given supportive care, which means rest and fluids. A person may also relieve his or her symptoms by using over-the-counter medicines that treat sneezing, coughing, and runny nose. These are the same medicines that a person takes  for the common cold. If you develop symptoms, call your health care provider. People with severe symptoms may need hospital care. What can I do to protect myself and my family from this virus?     You can protect yourself and your family by taking the same actions that you would take to prevent the spread of other viruses. Take the following actions:  Wash your hands often with soap and water for at least 20 seconds. If soap and water are not available, use alcohol-based hand sanitizer.  Avoid touching your face, mouth, nose, or eyes.  Cough or sneeze into a tissue, sleeve, or elbow. Do not cough  or sneeze into your hand or the air. ? If you cough or sneeze into a tissue, throw it away immediately and wash your hands.  Disinfect objects and surfaces that you frequently touch every day.  Stay away from people who are sick.  Avoid going out in public, follow guidance from your state and local health authorities.  Avoid crowded indoor spaces. Stay at least 6 ft (2 m) away from others.  If you must go out in public, wear a cloth face covering or face mask. Make sure your mask covers your nose and mouth.  Stay home if you are sick, except to get medical care. Call your health care provider before you get medical care. Your health care provider will tell you how long to stay home.  Make sure your vaccines are up to date. Ask your health care provider what vaccines you need. What should I do if I need to travel? Follow travel recommendations from your local health authority, the CDC, and WHO. Travel information and advice  Centers for Disease Control and Prevention (CDC): GeminiCard.gl  World Health Organization [facility]): PreviewDomains.se Know the risks and take action to protect your health  You are at higher risk of getting coronavirus disease if you are traveling to areas with an outbreak or if  you are exposed to travelers from areas with an outbreak.  Wash your hands often and practice good hygiene to lower the risk of catching or spreading the virus. What should I do if I am sick? General instructions to stop the spread of infection  Wash your hands often with soap and water for at least 20 seconds. If soap and water are not available, use alcohol-based hand sanitizer.  Cough or sneeze into a tissue, sleeve, or elbow. Do not cough or sneeze into your hand or the air.  If you cough or sneeze into a tissue, throw it away immediately and wash your hands.  Stay home unless you must get medical care. Call your health care provider or local health authority before you get medical care.  Avoid public areas. Do not take public transportation, if possible.  If you can, wear a mask if you must go out of the house or if you are in close contact with someone who is not sick. Make sure your mask covers your nose and mouth. Keep your home clean  Disinfect objects and surfaces that are frequently touched every day. This may include: ? Counters and tables. ? Doorknobs and light switches. ? Sinks and faucets. ? Electronics such as phones, remote controls, keyboards, computers, and tablets.  Wash dishes in hot, soapy water or use a dishwasher. Air-dry your dishes.  Wash laundry in hot water. Prevent infecting other household members  Let healthy household members care for children and pets, if possible. If you have to care for children or pets, wash your hands often and wear a mask.  Sleep in a different bedroom or bed, if possible.  Do not share personal items, such as razors, toothbrushes, deodorant, combs, brushes, towels, and washcloths. Where to find more information Centers for Disease Control and Prevention (CDC)  Information and news updates: CardRetirement.cz World Health Organization [facility])  Information and news updates:  AffordableSalon.es  Coronavirus health topic: https://thompson-craig.com/  Questions and answers on COVID-19: kruiseway.com  Global tracker: who.sprinklr.com American Academy of Pediatrics (AAP)  Information for families: www.healthychildren.org/English/health-issues/conditions/chest-lungs/Pages/2019-Novel-Coronavirus.aspx The coronavirus situation is changing rapidly. Check your local health authority website or the CDC and [facility] websites for updates and  news. When should I contact a health care provider?  Contact your health care provider if you have symptoms of an infection, such as fever or cough, and you: ? Have been near anyone who is known to have coronavirus disease. ? Have come into contact with a person who is suspected to have coronavirus disease. ? Have traveled to an area where there is an outbreak of COVID-19. When should I get emergency medical care?  Get help right away by calling your local emergency services (911 in the [place] if you have: ? Trouble breathing. ? Pain or pressure in your chest. ? Confusion. ? Blue-tinged lips and fingernails. ? Difficulty waking from sleep. ? Symptoms that get worse. Let the emergency medical personnel know if you think you have coronavirus disease. Summary  A new respiratory virus is spreading from person to person and causing COVID-19 (coronavirus disease).  The virus that causes COVID-19 appears to spread easily. It spreads from one person to another through droplets from breathing, speaking, singing, coughing, or sneezing.  Older adults and those with chronic diseases are at higher risk of disease. If you are at higher risk for complications, take extra precautions.  There is currently no vaccine to prevent coronavirus disease. There are no medicines, such as antibiotics or antivirals, to treat the virus.  You can protect yourself and your family  by washing your hands often, avoiding touching your face, and covering your coughs and sneezes. This information is not intended to replace advice given to you by your health care provider. Make sure you discuss any questions you have with your health care provider. Document Revised: 02/18/2019 Document Reviewed: 08/17/2018 Elsevier Patient Education  2020 [employer].

## 2019-07-03 NOTE — Discharge Summary (Signed)
DISCHARGE SUMMARY  Hannah Edwards  MR#: [id number]  DOB:21-Jun-1950  Date of Admission: 06/30/2019 Date of Discharge: 07/03/2019  Attending Physician:Hassie Mandt Silvestre Gunner, MD  Patient's TDV:VOHY, Salley Hews, PA-C  Consults: none  Disposition: D/C home   Date of Positive COVID Test: 06/29/19  Date Quarantine Ends: 07/20/19  COVID-19 specific Treatment: Decadron 2/24 > Remdesivir 2/24 > 2/28  Follow-up Appts: Follow-up Information    Particia Nearing, PA-C Follow up in 10 day(s).   Specialty: Family Medicine Contact information: [address] [place] [address] [phone]           Discharge Diagnoses: COVID Pneumonia Acute hypoxic respiratory failure HTN Obesity - Body mass index is 47.03 kg/m.  Initial presentation: 69 year old with a history of HTN and morbid obesity who presented to the [facility] ED with 3 days of fevers, chills, body aches, and shortness of breath after a sick contact at work.  In the ED she was found to have a saturation of 88% on room air, was tachycardic, and a CXR was without evidence of a clear pulmonary infiltrate.  Her Covid test was positive.  Hospital Course:  COVID Pneumonia - Acute hypoxic respiratory failure Competed a course of Remdesivir - cont to mobilize - incentive spirometer - decadron course abbreviated due to rapid rate of improvement and resolution of hypoxia - no longer requiring O2 support at time of d/c home - ambulating without difficulty - anxious to go home - doing very well   HTN Blood pressure well controlled at time of d/c   Obesity - Body mass index is 47.03 kg/m.  Allergies as of 07/03/2019      Reactions   Ace Inhibitors Anaphylaxis   Lisinopril Anaphylaxis   Atorvastatin Other (See Comments)   Body aches      Medication List    TAKE these medications   acetaminophen 500 MG tablet Commonly known as: TYLENOL Take 1,000 mg by mouth every 6 (six) hours as needed.   carvedilol 12.5 MG  tablet Commonly known as: COREG Take 1 tablet (12.5 mg total) by mouth 2 (two) times daily with a meal.   omeprazole 20 MG capsule Commonly known as: PRILOSEC Take 1 capsule (20 mg total) by mouth daily.       Day of Discharge BP 139/82 (BP Location: Right Arm)   Pulse 67   Temp 98.1 F (36.7 C) (Oral)   Resp 16   Ht 5\' 5"  (1.651 m)   Wt 128.2 kg   LMP  (LMP Unknown)   SpO2 95%   BMI 47.03 kg/m   Physical Exam: General: No acute respiratory distress Lungs: Clear to auscultation bilaterally without wheezes or crackles Cardiovascular: Regular rate and rhythm without murmur  Abdomen: Nontender, nondistended, soft, bowel sounds positive, no rebound Extremities: No significant cyanosis, clubbing, or edema bilateral lower extremities  Basic Metabolic Panel: Recent Labs  Lab 06/29/19 2219 07/01/19 0426 07/02/19 0431  NA 137 139 140  K 3.7 4.6 4.6  CL 101 106 107  CO2 25 26 25   GLUCOSE 113* 145* 168*  BUN 11 15 18   CREATININE 0.95 0.76 0.72  CALCIUM 8.8* 8.5* 8.8*  MG  --  2.2  --     Liver Function Tests: Recent Labs  Lab 06/29/19 2219 07/01/19 0426 07/02/19 0431  AST 25 23 21   ALT 16 16 21   ALKPHOS 55 49 48  BILITOT 0.8 0.6 0.5  PROT 7.7 7.2 7.2  ALBUMIN 3.9 3.4* 3.4*    CBC: Recent  Labs  Lab 06/29/19 2219 07/01/19 0426 07/02/19 0431  WBC 6.9 5.9 6.0  NEUTROABS  --  4.2 4.5  HGB 13.2 13.0 12.9  HCT 41.1 40.5 40.6  MCV 90.3 91.6 91.6  PLT 291 309 332    Time spent in discharge (includes decision making & examination of pt): 35 minutes  07/03/2019, 3:23 PM   Cherene Altes, MD [facility] Office  [phone]

## 2019-07-04 ENCOUNTER — Ambulatory Visit (INDEPENDENT_AMBULATORY_CARE_PROVIDER_SITE_OTHER): Payer: Medicare Other

## 2019-07-04 DIAGNOSIS — Z0000 Encounter for general adult medical examination without abnormal findings: Secondary | ICD-10-CM | POA: Diagnosis not present

## 2019-07-04 NOTE — Patient Instructions (Signed)
Hannah Edwards , Thank you for taking time to come for your Medicare Wellness Visit. I appreciate your ongoing commitment to your health goals. Please review the following plan we discussed and let me know if I can assist you in the future.   Screening recommendations/referrals: Colonoscopy: cologuard due 02/2020 Mammogram: ordered Please call [phone] to schedule your mammogram.  Bone Density: up to date  Recommended yearly ophthalmology/optometry visit for glaucoma screening and checkup Recommended yearly dental visit for hygiene and checkup  Vaccinations: Influenza vaccine: up to date  Pneumococcal vaccine: up to date  Tdap vaccine:due  Shingles vaccine: shingrix eligible    Covid-19: eligible in 90 days   Advanced directives: Advance directive discussed with you today.  Once this is complete please bring a copy in to our office so we can scan it into your chart.  Conditions/risks identified: please call if you need anything.   Next appointment: Follow up in one year for your annual wellness visit   Preventive Care 65 Years and Older, Female Preventive care refers to lifestyle choices and visits with your health care provider that can promote health and wellness. What does preventive care include?  A yearly physical exam. This is also called an annual well check.  Dental exams once or twice a year.  Routine eye exams. Ask your health care provider how often you should have your eyes checked.  Personal lifestyle choices, including:  Daily care of your teeth and gums.  Regular physical activity.  Eating a healthy diet.  Avoiding tobacco and drug use.  Limiting alcohol use.  Practicing safe sex.  Taking low-dose aspirin every day.  Taking vitamin and mineral supplements as recommended by your health care provider. What happens during an annual well check? The services and screenings done by your health care provider during your annual well check will depend on your  age, overall health, lifestyle risk factors, and family history of disease. Counseling  Your health care provider may ask you questions about your:  Alcohol use.  Tobacco use.  Drug use.  Emotional well-being.  Home and relationship well-being.  Sexual activity.  Eating habits.  History of falls.  Memory and ability to understand (cognition).  Work and work Statistician.  Reproductive health. Screening  You may have the following tests or measurements:  Height, weight, and BMI.  Blood pressure.  Lipid and cholesterol levels. These may be checked every 5 years, or more frequently if you are over 81 years old.  Skin check.  Lung cancer screening. You may have this screening every year starting at age 108 if you have a 30-pack-year history of smoking and currently smoke or have quit within the past 15 years.  Fecal occult blood test (FOBT) of the stool. You may have this test every year starting at age 44.  Flexible sigmoidoscopy or colonoscopy. You may have a sigmoidoscopy every 5 years or a colonoscopy every 10 years starting at age 43.  Hepatitis C blood test.  Hepatitis B blood test.  Sexually transmitted disease (STD) testing.  Diabetes screening. This is done by checking your blood sugar (glucose) after you have not eaten for a while (fasting). You may have this done every 1-3 years.  Bone density scan. This is done to screen for osteoporosis. You may have this done starting at age 69.  Mammogram. This may be done every 1-2 years. Talk to your health care provider about how often you should have regular mammograms. Talk with your health care provider about  your test results, treatment options, and if necessary, the need for more tests. Vaccines  Your health care provider may recommend certain vaccines, such as:  Influenza vaccine. This is recommended every year.  Tetanus, diphtheria, and acellular pertussis (Tdap, Td) vaccine. You may need a Td booster  every 10 years.  Zoster vaccine. You may need this after age 18.  Pneumococcal 13-valent conjugate (PCV13) vaccine. One dose is recommended after age 28.  Pneumococcal polysaccharide (PPSV23) vaccine. One dose is recommended after age 62. Talk to your health care provider about which screenings and vaccines you need and how often you need them. This information is not intended to replace advice given to you by your health care provider. Make sure you discuss any questions you have with your health care provider. Document Released: 05/18/2015 Document Revised: 01/09/2016 Document Reviewed: 02/20/2015 Elsevier Interactive Patient Education  2017 [place] Prevention in the Home Falls can cause injuries. They can happen to people of all ages. There are many things you can do to make your home safe and to help prevent falls. What can I do on the outside of my home?  Regularly fix the edges of walkways and driveways and fix any cracks.  Remove anything that might make you trip as you walk through a door, such as a raised step or threshold.  Trim any bushes or trees on the path to your home.  Use bright outdoor lighting.  Clear any walking paths of anything that might make someone trip, such as rocks or tools.  Regularly check to see if handrails are loose or broken. Make sure that both sides of any steps have handrails.  Any raised decks and porches should have guardrails on the edges.  Have any leaves, snow, or ice cleared regularly.  Use sand or salt on walking paths during winter.  Clean up any spills in your garage right away. This includes oil or grease spills. What can I do in the bathroom?  Use night lights.  Install grab bars by the toilet and in the tub and shower. Do not use towel bars as grab bars.  Use non-skid mats or decals in the tub or shower.  If you need to sit down in the shower, use a plastic, non-slip stool.  Keep the floor dry. Clean up any  water that spills on the floor as soon as it happens.  Remove soap buildup in the tub or shower regularly.  Attach bath mats securely with double-sided non-slip rug tape.  Do not have throw rugs and other things on the floor that can make you trip. What can I do in the bedroom?  Use night lights.  Make sure that you have a light by your bed that is easy to reach.  Do not use any sheets or blankets that are too big for your bed. They should not hang down onto the floor.  Have a firm chair that has side arms. You can use this for support while you get dressed.  Do not have throw rugs and other things on the floor that can make you trip. What can I do in the kitchen?  Clean up any spills right away.  Avoid walking on wet floors.  Keep items that you use a lot in easy-to-reach places.  If you need to reach something above you, use a strong step stool that has a grab bar.  Keep electrical cords out of the way.  Do not use floor polish or wax  that makes floors slippery. If you must use wax, use non-skid floor wax.  Do not have throw rugs and other things on the floor that can make you trip. What can I do with my stairs?  Do not leave any items on the stairs.  Make sure that there are handrails on both sides of the stairs and use them. Fix handrails that are broken or loose. Make sure that handrails are as long as the stairways.  Check any carpeting to make sure that it is firmly attached to the stairs. Fix any carpet that is loose or worn.  Avoid having throw rugs at the top or bottom of the stairs. If you do have throw rugs, attach them to the floor with carpet tape.  Make sure that you have a light switch at the top of the stairs and the bottom of the stairs. If you do not have them, ask someone to add them for you. What else can I do to help prevent falls?  Wear shoes that:  Do not have high heels.  Have rubber bottoms.  Are comfortable and fit you well.  Are closed  at the toe. Do not wear sandals.  If you use a stepladder:  Make sure that it is fully opened. Do not climb a closed stepladder.  Make sure that both sides of the stepladder are locked into place.  Ask someone to hold it for you, if possible.  Clearly mark and make sure that you can see:  Any grab bars or handrails.  First and last steps.  Where the edge of each step is.  Use tools that help you move around (mobility aids) if they are needed. These include:  Canes.  Walkers.  Scooters.  Crutches.  Turn on the lights when you go into a dark area. Replace any light bulbs as soon as they burn out.  Set up your furniture so you have a clear path. Avoid moving your furniture around.  If any of your floors are uneven, fix them.  If there are any pets around you, be aware of where they are.  Review your medicines with your doctor. Some medicines can make you feel dizzy. This can increase your chance of falling. Ask your doctor what other things that you can do to help prevent falls. This information is not intended to replace advice given to you by your health care provider. Make sure you discuss any questions you have with your health care provider. Document Released: 02/15/2009 Document Revised: 09/27/2015 Document Reviewed: 05/26/2014 Elsevier Interactive Patient Education  2017 [employer].

## 2019-07-04 NOTE — Progress Notes (Signed)
Subjective:   Hannah Edwards is a 69 y.o. female who presents for Medicare Annual (Subsequent) preventive examination.  This visit is being conducted via phone call  - after an attmept to do on video chat - due to the COVID-19 pandemic. This patient has given me verbal consent via phone to conduct this visit, patient states they are participating from their home address. Some vital signs may be absent or patient reported.   Patient identification: identified by name, DOB, and current address.    Review of Systems:   Cardiac Risk Factors include: advanced age (>61men, >58 women);hypertension;obesity (BMI >30kg/m2)     Objective:     Vitals: LMP  (LMP Unknown)   There is no height or weight on file to calculate BMI.  Advanced Directives 07/04/2019 06/30/2019 06/29/2019 01/13/2018 12/10/2016 05/11/2015 01/03/2015  Does Patient Have a Medical Advance Directive? No No No No No No No  Would patient like information on creating a medical advance directive? - No - Patient declined - Yes (MAU/Ambulatory/Procedural Areas - Information given) Yes (ED - Information included in AVS) - No - patient declined information    Tobacco Social History   Tobacco Use  Smoking Status Former Smoker  . Types: Cigarettes  . Quit date: 01/05/1975  . Years since quitting: 44.5  Smokeless Tobacco Never Used     Counseling given: Not Answered   Clinical Intake:  Pre-visit preparation completed: Yes  Pain : No/denies pain Pain Score: 0-No pain     Nutritional Status: BMI > 30  Obese Nutritional Risks: None Diabetes: No  How often do you need to have someone help you when you read instructions, pamphlets, or other written materials from your doctor or pharmacy?: 1 - Never  Interpreter Needed?: No  Information entered by :: Davinity Fanara,LPN  History reviewed. No pertinent past medical history. Past Surgical History:  Procedure Laterality Date  . ABDOMINAL HYSTERECTOMY    . WRIST SURGERY Left     Family History  Problem Relation Age of Onset  . Hypertension Mother   . Cancer Mother        unknown type   . Hypertension Father   . Aneurysm Father   . Stomach cancer Sister   . Diabetes Brother   . HIV/AIDS Brother   . Breast cancer Neg Hx   . Lung cancer Neg Hx    Social History   Socioeconomic History  . Marital status: Widowed    Spouse name: Not on file  . Number of children: Not on file  . Years of education: Not on file  . Highest education level: Bachelor's degree (e.g., BA, AB, BS)  Occupational History  . Not on file  Tobacco Use  . Smoking status: Former Smoker    Types: Cigarettes    Quit date: 01/05/1975    Years since quitting: 44.5  . Smokeless tobacco: Never Used  Substance and Sexual Activity  . Alcohol use: No    Alcohol/week: 0.0 standard drinks  . Drug use: No  . Sexual activity: Never  Other Topics Concern  . Not on file  Social History Narrative   Working full time    Social Determinants of Radio broadcast assistant Strain:   . Difficulty of Paying Living Expenses: Not on file  Food Insecurity:   . Worried About Charity fundraiser in the Last Year: Not on file  . Ran Out of Food in the Last Year: Not on file  Transportation Needs:   .  Lack of Transportation (Medical): Not on file  . Lack of Transportation (Non-Medical): Not on file  Physical Activity:   . Days of Exercise per Week: Not on file  . Minutes of Exercise per Session: Not on file  Stress:   . Feeling of Stress : Not on file  Social Connections:   . Frequency of Communication with Friends and Family: Not on file  . Frequency of Social Gatherings with Friends and Family: Not on file  . Attends Religious Services: Not on file  . Active Member of Clubs or Organizations: Not on file  . Attends Banker Meetings: Not on file  . Marital Status: Not on file    Outpatient Encounter Medications as of 07/04/2019  Medication Sig  . acetaminophen (TYLENOL) 500 MG  tablet Take 1,000 mg by mouth every 6 (six) hours as needed.  . carvedilol (COREG) 12.5 MG tablet Take 1 tablet (12.5 mg total) by mouth 2 (two) times daily with a meal.  . omeprazole (PRILOSEC) 20 MG capsule Take 1 capsule (20 mg total) by mouth daily.   No facility-administered encounter medications on file as of 07/04/2019.    Activities of Daily Living In your present state of health, do you have any difficulty performing the following activities: 07/04/2019 06/30/2019  Hearing? N N  Comment no hearing aids -  Vision? N N  Comment eyeglasses, my eye dr annually -  Difficulty concentrating or making decisions? N N  Walking or climbing stairs? N N  Dressing or bathing? N N  Doing errands, shopping? N N  Preparing Food and eating ? N -  Using the Toilet? N -  In the past six months, have you accidently leaked urine? N -  Do you have problems with loss of bowel control? N -  Managing your Medications? N -  Managing your Finances? N -  Housekeeping or managing your Housekeeping? N -  Some recent data might be hidden    Patient Care Team: Particia Nearing, PA-C as PCP - General (Family Medicine)    Assessment:   This is a routine wellness examination for Hannah Edwards.  Exercise Activities and Dietary recommendations Current Exercise Habits: Home exercise routine, Type of exercise: strength training/weights(using arm bands), Time (Minutes): 30, Frequency (Times/Week): 7, Weekly Exercise (Minutes/Week): 210, Intensity: Mild, Exercise limited by: None identified  Goals Addressed   None     Fall Risk: Fall Risk  07/04/2019 11/16/2018 01/13/2018 12/10/2016 05/11/2015  Falls in the past year? 0 0 No No Yes  Number falls in past yr: 0 0 - - 1  Injury with Fall? 0 0 - - No    FALL RISK PREVENTION PERTAINING TO THE HOME:  Any stairs in or around the home? Yes  If so, are there any without handrails? No   Home free of loose throw rugs in walkways, pet beds, electrical cords, etc? Yes    Adequate lighting in your home to reduce risk of falls? Yes   ASSISTIVE DEVICES UTILIZED TO PREVENT FALLS:  Life alert? No  Use of a cane, walker or w/c? No  Grab bars in the bathroom? No  Shower chair or bench in shower? No  Elevated toilet seat or a handicapped toilet? No   DME ORDERS:  DME order needed?  No   TIMED UP AND GO:  Unable to perform    Depression Screen PHQ 2/9 Scores 07/04/2019 01/13/2018 12/10/2016 05/11/2015  PHQ - 2 Score 0 0 0 0  PHQ- 9  Score - - 0 -     Cognitive Function     6CIT Screen 01/13/2018  What Year? 0 points  What month? 0 points  What time? 0 points  Count back from 20 0 points  Months in reverse 0 points  Repeat phrase 0 points  Total Score 0    Immunization History  Administered Date(s) Administered  . Fluad Quad(high Dose 65+) 02/08/2019  . Influenza, High Dose Seasonal PF 01/13/2018  . Influenza-Unspecified 02/12/2015  . PPD Test 09/09/2013  . Pneumococcal Conjugate-13 12/10/2016  . Pneumococcal Polysaccharide-23 01/13/2018  . Zoster 06/17/2010    Qualifies for Shingles Vaccine? Yes  Zostavax completed 2012. Due for Shingrix. Education has been provided regarding the importance of this vaccine. Pt has been advised to call insurance company to determine out of pocket expense. Advised may also receive vaccine at local pharmacy or Health Dept. Verbalized acceptance and understanding.  Tdap: Discussed need for TD/TDAP vaccine, patient verbalized understanding that this is not covered as a preventative with there insurance and to call the office if she develops any new skin injuries, ie: cuts, scrapes, bug bites, or open wounds.  Flu Vaccine: up to date   Pneumococcal Vaccine: up to date .   Covid-19 Vaccine: has covid currently   Screening Tests Health Maintenance  Topic Date Due  . TETANUS/TDAP  05/22/1968  . MAMMOGRAM  01/23/2019  . Fecal DNA (Cologuard)  02/28/2020  . INFLUENZA VACCINE  Completed  . DEXA SCAN  Completed   . Hepatitis C Screening  Completed  . PNA vac Low Risk Adult  Completed    Cancer Screenings:  Colorectal Screening: Completed 02/27/2017. Repeat every 3 years  Mammogram: ordered.   Bone Density: Completed 2018  Lung Cancer Screening: (Low Dose CT Chest recommended if Age 60-80 years, 30 pack-year currently smoking OR have quit w/in 15years.) does not qualify.     Additional Screening:  Hepatitis C Screening: does qualify; Completed 2017  Vision Screening: Recommended annual ophthalmology exams for early detection of glaucoma and other disorders of the eye. Is the patient up to date with their annual eye exam?  Yes  Who is the provider or what is the name of the office in which the pt attends annual eye exams? My eye dr    Dental Screening: Recommended annual dental exams for proper oral hygiene  Community Resource Referral:  CRR required this visit?  No       Plan:  I have personally reviewed and addressed the Medicare Annual Wellness questionnaire and have noted the following in the patient's chart:  A. Medical and social history B. Use of alcohol, tobacco or illicit drugs  C. Current medications and supplements D. Functional ability and status E.  Nutritional status F.  Physical activity G. Advance directives H. List of other physicians I.  Hospitalizations, surgeries, and ER visits in previous 12 months J.  Vitals K. Screenings such as hearing and vision if needed, cognitive and depression L. Referrals and appointments   In addition, I have reviewed and discussed with patient certain preventive protocols, quality metrics, and best practice recommendations. A written personalized care plan for preventive services as well as general preventive health recommendations were provided to patient.  Signed,    Collene Schlichter, LPN  01/08/7590 Nurse Health Advisor   Nurse Notes: completed TCM call during awv visit. See other note from today. hosp follow up  scheduled.

## 2019-07-04 NOTE — Progress Notes (Signed)
Transition Care Management Follow-up Telephone Call  Date of discharge and from where: 07/03/2019 [facility]  How have you been since you were released from the hospital? "better"  Any questions or concerns? Yes  questions about eligibility for covid-19 vaccine.   Items Reviewed:  Did the pt receive and understand the discharge instructions provided? Yes   Medications obtained and verified? Yes   Any new allergies since your discharge? Yes   Dietary orders reviewed? Yes  Do you have support at home? Yes   Functional Questionnaire: (I = Independent and D = Dependent) ADLs: i  Bathing/Dressing- i  Meal Prep- i  Eating- i  Maintaining continence- i  Transferring/Ambulation- i  Managing Meds- i  Follow up appointments reviewed:   PCP Hospital f/u appt confirmed? Yes  Scheduled to see Margit Hanks on 07/07/2019 @ 2pm via virtual appt .  Specialist Hospital f/u appt confirmed? No    Are transportation arrangements needed? No   If their condition worsens, is the pt aware to call PCP or go to the Emergency Dept.? Yes  Was the patient provided with contact information for the PCP's office or ED? Yes  Was to pt encouraged to call back with questions or concerns? Yes

## 2019-07-07 ENCOUNTER — Inpatient Hospital Stay: Payer: Self-pay | Admitting: Family Medicine

## 2019-07-07 ENCOUNTER — Telehealth (INDEPENDENT_AMBULATORY_CARE_PROVIDER_SITE_OTHER): Payer: Medicare Other | Admitting: Family Medicine

## 2019-07-07 ENCOUNTER — Encounter: Payer: Self-pay | Admitting: Family Medicine

## 2019-07-07 VITALS — BP 153/76 | HR 85 | Temp 97.3°F

## 2019-07-07 DIAGNOSIS — J1282 Pneumonia due to coronavirus disease 2019: Secondary | ICD-10-CM

## 2019-07-07 DIAGNOSIS — U071 COVID-19: Secondary | ICD-10-CM | POA: Diagnosis not present

## 2019-07-07 NOTE — Progress Notes (Signed)
BP (!) 153/76   Pulse 85   Temp (!) 97.3 F (36.3 C)   LMP  (LMP Unknown)    Subjective:    Patient ID: Hannah Edwards, female    DOB: 1950-10-24, 69 y.o.   MRN: [id number]  HPI: Hannah Edwards is a 69 y.o. female  Chief Complaint  Patient presents with  . Hospitalization Follow-up    . This visit was completed via MyChart due to the restrictions of the COVID-19 pandemic. All issues as above were discussed and addressed. Physical exam was done as above through visual confirmation on MyChart. If it was felt that the patient should be evaluated in the office, they were directed there. The patient verbally consented to this visit. . Location of the patient: home . Location of the provider: work . Those involved with this call:  . Provider: Roosvelt Maser, PA-C . CMA: Elton Sin, CMA . Front Desk/Registration: Harriet Pho  . Time spent on call: 15 minutes with patient face to face via video conference. More than 50% of this time was spent in counseling and coordination of care. 5 minutes total spent in review of patient's record and preparation of their chart. I verified patient identity using two factors (patient name and date of birth). Patient consents verbally to being seen via telemedicine visit today.   Patient presenting today for hospital follow up after admission for COVID pneumonia. Was given antiviral, decadron and supportive care and improved without significant complication or need for ventilation. Overall feeling ok since d/c, working to get supplies to check vitals at home. Requesting home health support to check on her/assist with vital signs checking as she lives alone. Performing ADLs well overall per patient. Aside from fatigue/weakness, doing well since d/c without fevers, significant cough, SOB, need for oxygen supplementation. No new medication changes.   Relevant past medical, surgical, family and social history reviewed and updated as indicated. Interim medical history  since our last visit reviewed. Allergies and medications reviewed and updated.  Review of Systems  Per HPI unless specifically indicated above     Objective:    BP (!) 153/76   Pulse 85   Temp (!) 97.3 F (36.3 C)   LMP  (LMP Unknown)   Wt Readings from Last 3 Encounters:  06/30/19 282 lb 10.1 oz (128.2 kg)  06/29/19 279 lb 15.8 oz (127 kg)  06/29/19 281 lb (127.5 kg)    Physical Exam Vitals and nursing note reviewed.  Constitutional:      General: She is not in acute distress.    Appearance: Normal appearance.  HENT:     Head: Atraumatic.     Right Ear: External ear normal.     Left Ear: External ear normal.     Nose: Nose normal. No congestion.     Mouth/Throat:     Mouth: Mucous membranes are moist.     Pharynx: Oropharynx is clear. No posterior oropharyngeal erythema.  Eyes:     Extraocular Movements: Extraocular movements intact.     Conjunctiva/sclera: Conjunctivae normal.  Cardiovascular:     Comments: Unable to assess via virtual visit Pulmonary:     Effort: Pulmonary effort is normal. No respiratory distress.     Comments: Speaking full sentences without labored breathing Musculoskeletal:        General: Normal range of motion.     Cervical back: Normal range of motion.  Skin:    General: Skin is dry.     Findings: No erythema.  Neurological:  Mental Status: She is alert and oriented to person, place, and time.  Psychiatric:        Mood and Affect: Mood normal.        Thought Content: Thought content normal.        Judgment: Judgment normal.     Results for orders placed or performed during the hospital encounter of 06/30/19  HIV Antibody (routine testing w rflx)  Result Value Ref Range   HIV Screen 4th Generation wRfx NON REACTIVE NON REACTIVE  CBC with Differential/Platelet  Result Value Ref Range   WBC 5.9 4.0 - 10.5 K/uL   RBC 4.42 3.87 - 5.11 MIL/uL   Hemoglobin 13.0 12.0 - 15.0 g/dL   HCT 40.5 36.0 - 46.0 %   MCV 91.6 80.0 - 100.0 fL    MCH 29.4 26.0 - 34.0 pg   MCHC 32.1 30.0 - 36.0 g/dL   RDW 14.7 11.5 - 15.5 %   Platelets 309 150 - 400 K/uL   nRBC 0.0 0.0 - 0.2 %   Neutrophils Relative % 71 %   Neutro Abs 4.2 1.7 - 7.7 K/uL   Lymphocytes Relative 23 %   Lymphs Abs 1.4 0.7 - 4.0 K/uL   Monocytes Relative 5 %   Monocytes Absolute 0.3 0.1 - 1.0 K/uL   Eosinophils Relative 0 %   Eosinophils Absolute 0.0 0.0 - 0.5 K/uL   Basophils Relative 0 %   Basophils Absolute 0.0 0.0 - 0.1 K/uL   Immature Granulocytes 1 %   Abs Immature Granulocytes 0.03 0.00 - 0.07 K/uL  Comprehensive metabolic panel  Result Value Ref Range   Sodium 139 135 - 145 mmol/L   Potassium 4.6 3.5 - 5.1 mmol/L   Chloride 106 98 - 111 mmol/L   CO2 26 22 - 32 mmol/L   Glucose, Bld 145 (H) 70 - 99 mg/dL   BUN 15 8 - 23 mg/dL   Creatinine, Ser 0.76 0.44 - 1.00 mg/dL   Calcium 8.5 (L) 8.9 - 10.3 mg/dL   Total Protein 7.2 6.5 - 8.1 g/dL   Albumin 3.4 (L) 3.5 - 5.0 g/dL   AST 23 15 - 41 U/L   ALT 16 0 - 44 U/L   Alkaline Phosphatase 49 38 - 126 U/L   Total Bilirubin 0.6 0.3 - 1.2 mg/dL   GFR calc non Af Amer >60 >60 mL/min   GFR calc Af Amer >60 >60 mL/min   Anion gap 7 5 - 15  C-reactive protein  Result Value Ref Range   CRP 3.2 (H) <1.0 mg/dL  D-dimer, quantitative (not at [facility])  Result Value Ref Range   D-Dimer, Quant 0.34 0.00 - 0.50 ug/mL-FEU  Magnesium  Result Value Ref Range   Magnesium 2.2 1.7 - 2.4 mg/dL  Ferritin  Result Value Ref Range   Ferritin 191 11 - 307 ng/mL  CBC with Differential/Platelet  Result Value Ref Range   WBC 6.0 4.0 - 10.5 K/uL   RBC 4.43 3.87 - 5.11 MIL/uL   Hemoglobin 12.9 12.0 - 15.0 g/dL   HCT 40.6 36.0 - 46.0 %   MCV 91.6 80.0 - 100.0 fL   MCH 29.1 26.0 - 34.0 pg   MCHC 31.8 30.0 - 36.0 g/dL   RDW 14.7 11.5 - 15.5 %   Platelets 332 150 - 400 K/uL   nRBC 0.0 0.0 - 0.2 %   Neutrophils Relative % 74 %   Neutro Abs 4.5 1.7 - 7.7 K/uL   Lymphocytes Relative 22 %  Lymphs Abs 1.3 0.7 - 4.0 K/uL    Monocytes Relative 3 %   Monocytes Absolute 0.2 0.1 - 1.0 K/uL   Eosinophils Relative 0 %   Eosinophils Absolute 0.0 0.0 - 0.5 K/uL   Basophils Relative 0 %   Basophils Absolute 0.0 0.0 - 0.1 K/uL   Immature Granulocytes 1 %   Abs Immature Granulocytes 0.03 0.00 - 0.07 K/uL  Comprehensive metabolic panel  Result Value Ref Range   Sodium 140 135 - 145 mmol/L   Potassium 4.6 3.5 - 5.1 mmol/L   Chloride 107 98 - 111 mmol/L   CO2 25 22 - 32 mmol/L   Glucose, Bld 168 (H) 70 - 99 mg/dL   BUN 18 8 - 23 mg/dL   Creatinine, Ser 3.41 0.44 - 1.00 mg/dL   Calcium 8.8 (L) 8.9 - 10.3 mg/dL   Total Protein 7.2 6.5 - 8.1 g/dL   Albumin 3.4 (L) 3.5 - 5.0 g/dL   AST 21 15 - 41 U/L   ALT 21 0 - 44 U/L   Alkaline Phosphatase 48 38 - 126 U/L   Total Bilirubin 0.5 0.3 - 1.2 mg/dL   GFR calc non Af Amer >60 >60 mL/min   GFR calc Af Amer >60 >60 mL/min   Anion gap 8 5 - 15  C-reactive protein  Result Value Ref Range   CRP 1.4 (H) <1.0 mg/dL  D-dimer, quantitative (not at [facility])  Result Value Ref Range   D-Dimer, Quant 0.29 0.00 - 0.50 ug/mL-FEU  Ferritin  Result Value Ref Range   Ferritin 200 11 - 307 ng/mL      Assessment & Plan:   Problem List Items Addressed This Visit    None    Visit Diagnoses    Pneumonia due to COVID-19 virus    -  Primary   Improving, discussed continued supportive care and return precautions. Requesting home health order and BP monitor/oximeter orders. Will arrange for both   Relevant Orders   Ambulatory referral to Home Health       Follow up plan: Return in about 6 weeks (around 08/18/2019) for 6 month f/u.

## 2019-07-08 ENCOUNTER — Telehealth: Payer: Self-pay

## 2019-07-08 NOTE — Telephone Encounter (Signed)
Called patient to ask where does she wants [place] to fax the Rx for pulse oximeter and BP cuff. Patient stated that she will call back to provide this information. Rx ready at my desk.

## 2019-07-12 ENCOUNTER — Telehealth: Payer: Self-pay

## 2019-07-12 NOTE — Telephone Encounter (Signed)
Called patient to verify where we need to fax over her Rx supply to. LVM for patient to return call to the office.

## 2019-07-12 NOTE — Telephone Encounter (Signed)
Patient called to say that she already have her supplies so please disregard original message. She will call if there is anything else needed

## 2019-07-13 ENCOUNTER — Telehealth: Payer: Self-pay

## 2019-07-13 NOTE — Telephone Encounter (Signed)
So should I shred the signed Rx for pulse oximeter and BP cuff?

## 2019-07-13 NOTE — Telephone Encounter (Signed)
Called patient to verify if she already got the supplies, pulse ox and BP cuff she was requesting for. Patient stated that her niece got them for her. FYI

## 2019-07-21 DIAGNOSIS — E669 Obesity, unspecified: Secondary | ICD-10-CM | POA: Diagnosis not present

## 2019-07-21 DIAGNOSIS — I10 Essential (primary) hypertension: Secondary | ICD-10-CM | POA: Diagnosis not present

## 2019-07-21 DIAGNOSIS — Z6841 Body Mass Index (BMI) 40.0 and over, adult: Secondary | ICD-10-CM | POA: Diagnosis not present

## 2019-07-21 DIAGNOSIS — R0602 Shortness of breath: Secondary | ICD-10-CM | POA: Diagnosis not present

## 2019-07-21 DIAGNOSIS — Z87891 Personal history of nicotine dependence: Secondary | ICD-10-CM | POA: Diagnosis not present

## 2019-07-21 DIAGNOSIS — K219 Gastro-esophageal reflux disease without esophagitis: Secondary | ICD-10-CM | POA: Diagnosis not present

## 2019-07-21 DIAGNOSIS — E785 Hyperlipidemia, unspecified: Secondary | ICD-10-CM | POA: Diagnosis not present

## 2019-07-21 DIAGNOSIS — J309 Allergic rhinitis, unspecified: Secondary | ICD-10-CM | POA: Diagnosis not present

## 2019-07-21 DIAGNOSIS — U071 COVID-19: Secondary | ICD-10-CM | POA: Diagnosis not present

## 2019-07-21 DIAGNOSIS — M8580 Other specified disorders of bone density and structure, unspecified site: Secondary | ICD-10-CM | POA: Diagnosis not present

## 2019-07-21 DIAGNOSIS — Z8701 Personal history of pneumonia (recurrent): Secondary | ICD-10-CM | POA: Diagnosis not present

## 2019-07-26 DIAGNOSIS — E669 Obesity, unspecified: Secondary | ICD-10-CM | POA: Diagnosis not present

## 2019-07-26 DIAGNOSIS — R0602 Shortness of breath: Secondary | ICD-10-CM | POA: Diagnosis not present

## 2019-07-26 DIAGNOSIS — U071 COVID-19: Secondary | ICD-10-CM | POA: Diagnosis not present

## 2019-07-26 DIAGNOSIS — E785 Hyperlipidemia, unspecified: Secondary | ICD-10-CM | POA: Diagnosis not present

## 2019-07-26 DIAGNOSIS — I10 Essential (primary) hypertension: Secondary | ICD-10-CM | POA: Diagnosis not present

## 2019-07-26 DIAGNOSIS — J309 Allergic rhinitis, unspecified: Secondary | ICD-10-CM | POA: Diagnosis not present

## 2019-07-27 ENCOUNTER — Other Ambulatory Visit: Payer: Self-pay

## 2019-07-27 ENCOUNTER — Ambulatory Visit (INDEPENDENT_AMBULATORY_CARE_PROVIDER_SITE_OTHER): Payer: Medicare Other | Admitting: Family Medicine

## 2019-07-27 ENCOUNTER — Encounter: Payer: Self-pay | Admitting: Family Medicine

## 2019-07-27 ENCOUNTER — Ambulatory Visit: Payer: Medicare Other | Admitting: Family Medicine

## 2019-07-27 VITALS — BP 121/85 | HR 106 | Temp 98.3°F | Ht 65.5 in | Wt 287.0 lb

## 2019-07-27 DIAGNOSIS — I10 Essential (primary) hypertension: Secondary | ICD-10-CM

## 2019-07-27 DIAGNOSIS — R0789 Other chest pain: Secondary | ICD-10-CM

## 2019-07-27 DIAGNOSIS — U071 COVID-19: Secondary | ICD-10-CM

## 2019-07-27 NOTE — Progress Notes (Signed)
BP 121/85   Pulse (!) 106   Temp 98.3 F (36.8 C) (Oral)   Ht 5' 5.5" (1.664 m)   Wt 287 lb (130.2 kg)   LMP  (LMP Unknown)   SpO2 97%   BMI 47.03 kg/m    Subjective:    Patient ID: Naysha Sholl, female    DOB: 01-Sep-1950, 69 y.o.   MRN: [id number]  HPI: Gurneet Matarese is a 69 y.o. female  Chief Complaint  Patient presents with  . Hospitalization Follow-up  . Hypertension    pt states tha she has not been taking the carvedilol this last 3 days due to side effects, feelings of chest and heart pressure while on medication   Presenting today with some sensations of palpitations and chest pressure for several days last week. Has had issues in the past with the carvedilol and is concerned being on it now is bothering her again. Felt a strange pulling downward feeling in chest that seems to be associated with the coreg. Stopped taking the medication 3 days ago, BPs running 118/70s range. Has not had any of the chest sensations since d/c of medication. Denies SOB, dizziness, syncope.   Patient also requesting a work note for clearance back to work. April 19th requesting to go back, wanting further time to rebound from the fatigue.   Relevant past medical, surgical, family and social history reviewed and updated as indicated. Interim medical history since our last visit reviewed. Allergies and medications reviewed and updated.  Review of Systems  Per HPI unless specifically indicated above     Objective:    BP 121/85   Pulse (!) 106   Temp 98.3 F (36.8 C) (Oral)   Ht 5' 5.5" (1.664 m)   Wt 287 lb (130.2 kg)   LMP  (LMP Unknown)   SpO2 97%   BMI 47.03 kg/m   Wt Readings from Last 3 Encounters:  07/27/19 287 lb (130.2 kg)  06/30/19 282 lb 10.1 oz (128.2 kg)  06/29/19 279 lb 15.8 oz (127 kg)    Physical Exam Vitals and nursing note reviewed.  Constitutional:      Appearance: Normal appearance. She is not ill-appearing.  HENT:     Head: Atraumatic.  Eyes:   Extraocular Movements: Extraocular movements intact.     Conjunctiva/sclera: Conjunctivae normal.  Cardiovascular:     Rate and Rhythm: Normal rate and regular rhythm.     Heart sounds: Normal heart sounds.  Pulmonary:     Effort: Pulmonary effort is normal.     Breath sounds: Normal breath sounds.  Musculoskeletal:        General: Normal range of motion.     Cervical back: Normal range of motion and neck supple.  Skin:    General: Skin is warm and dry.  Neurological:     Mental Status: She is alert and oriented to person, place, and time.  Psychiatric:        Mood and Affect: Mood normal.        Thought Content: Thought content normal.        Judgment: Judgment normal.     Results for orders placed or performed during the hospital encounter of 06/30/19  HIV Antibody (routine testing w rflx)  Result Value Ref Range   HIV Screen 4th Generation wRfx NON REACTIVE NON REACTIVE  CBC with Differential/Platelet  Result Value Ref Range   WBC 5.9 4.0 - 10.5 K/uL   RBC 4.42 3.87 - 5.11 MIL/uL   Hemoglobin  13.0 12.0 - 15.0 g/dL   HCT 40.5 36.0 - 46.0 %   MCV 91.6 80.0 - 100.0 fL   MCH 29.4 26.0 - 34.0 pg   MCHC 32.1 30.0 - 36.0 g/dL   RDW 14.7 11.5 - 15.5 %   Platelets 309 150 - 400 K/uL   nRBC 0.0 0.0 - 0.2 %   Neutrophils Relative % 71 %   Neutro Abs 4.2 1.7 - 7.7 K/uL   Lymphocytes Relative 23 %   Lymphs Abs 1.4 0.7 - 4.0 K/uL   Monocytes Relative 5 %   Monocytes Absolute 0.3 0.1 - 1.0 K/uL   Eosinophils Relative 0 %   Eosinophils Absolute 0.0 0.0 - 0.5 K/uL   Basophils Relative 0 %   Basophils Absolute 0.0 0.0 - 0.1 K/uL   Immature Granulocytes 1 %   Abs Immature Granulocytes 0.03 0.00 - 0.07 K/uL  Comprehensive metabolic panel  Result Value Ref Range   Sodium 139 135 - 145 mmol/L   Potassium 4.6 3.5 - 5.1 mmol/L   Chloride 106 98 - 111 mmol/L   CO2 26 22 - 32 mmol/L   Glucose, Bld 145 (H) 70 - 99 mg/dL   BUN 15 8 - 23 mg/dL   Creatinine, Ser 0.76 0.44 - 1.00 mg/dL     Calcium 8.5 (L) 8.9 - 10.3 mg/dL   Total Protein 7.2 6.5 - 8.1 g/dL   Albumin 3.4 (L) 3.5 - 5.0 g/dL   AST 23 15 - 41 U/L   ALT 16 0 - 44 U/L   Alkaline Phosphatase 49 38 - 126 U/L   Total Bilirubin 0.6 0.3 - 1.2 mg/dL   GFR calc non Af Amer >60 >60 mL/min   GFR calc Af Amer >60 >60 mL/min   Anion gap 7 5 - 15  C-reactive protein  Result Value Ref Range   CRP 3.2 (H) <1.0 mg/dL  D-dimer, quantitative (not at [facility])  Result Value Ref Range   D-Dimer, Quant 0.34 0.00 - 0.50 ug/mL-FEU  Magnesium  Result Value Ref Range   Magnesium 2.2 1.7 - 2.4 mg/dL  Ferritin  Result Value Ref Range   Ferritin 191 11 - 307 ng/mL  CBC with Differential/Platelet  Result Value Ref Range   WBC 6.0 4.0 - 10.5 K/uL   RBC 4.43 3.87 - 5.11 MIL/uL   Hemoglobin 12.9 12.0 - 15.0 g/dL   HCT 40.6 36.0 - 46.0 %   MCV 91.6 80.0 - 100.0 fL   MCH 29.1 26.0 - 34.0 pg   MCHC 31.8 30.0 - 36.0 g/dL   RDW 14.7 11.5 - 15.5 %   Platelets 332 150 - 400 K/uL   nRBC 0.0 0.0 - 0.2 %   Neutrophils Relative % 74 %   Neutro Abs 4.5 1.7 - 7.7 K/uL   Lymphocytes Relative 22 %   Lymphs Abs 1.3 0.7 - 4.0 K/uL   Monocytes Relative 3 %   Monocytes Absolute 0.2 0.1 - 1.0 K/uL   Eosinophils Relative 0 %   Eosinophils Absolute 0.0 0.0 - 0.5 K/uL   Basophils Relative 0 %   Basophils Absolute 0.0 0.0 - 0.1 K/uL   Immature Granulocytes 1 %   Abs Immature Granulocytes 0.03 0.00 - 0.07 K/uL  Comprehensive metabolic panel  Result Value Ref Range   Sodium 140 135 - 145 mmol/L   Potassium 4.6 3.5 - 5.1 mmol/L   Chloride 107 98 - 111 mmol/L   CO2 25 22 - 32 mmol/L  Glucose, Bld 168 (H) 70 - 99 mg/dL   BUN 18 8 - 23 mg/dL   Creatinine, Ser 0.25 0.44 - 1.00 mg/dL   Calcium 8.8 (L) 8.9 - 10.3 mg/dL   Total Protein 7.2 6.5 - 8.1 g/dL   Albumin 3.4 (L) 3.5 - 5.0 g/dL   AST 21 15 - 41 U/L   ALT 21 0 - 44 U/L   Alkaline Phosphatase 48 38 - 126 U/L   Total Bilirubin 0.5 0.3 - 1.2 mg/dL   GFR calc non Af Amer >60 >60 mL/min    GFR calc Af Amer >60 >60 mL/min   Anion gap 8 5 - 15  C-reactive protein  Result Value Ref Range   CRP 1.4 (H) <1.0 mg/dL  D-dimer, quantitative (not at [facility])  Result Value Ref Range   D-Dimer, Quant 0.29 0.00 - 0.50 ug/mL-FEU  Ferritin  Result Value Ref Range   Ferritin 200 11 - 307 ng/mL      Assessment & Plan:   Problem List Items Addressed This Visit      Cardiovascular and Mediastinum   Essential hypertension - Primary    Will continue d/c of coreg as she feels it's causing side effects. Home BPs since d/c have been stable and under good control. Continue to monitor home readings closely, call with persistent abnormal readings prior to return OV        Other   COVID-19 virus infection    Resolving but lingering fatigue. Will provide return to work note as requested and continue to monitor       Other Visit Diagnoses    Chest pressure       Consistent with previous side effects on bblockers, will continue d/c of medication and monitor home readings. F/u in 1 month for recheck       Follow up plan: Return in about 4 weeks (around 08/24/2019) for 6 month f/u.

## 2019-07-28 DIAGNOSIS — U071 COVID-19: Secondary | ICD-10-CM | POA: Diagnosis not present

## 2019-07-28 DIAGNOSIS — E669 Obesity, unspecified: Secondary | ICD-10-CM | POA: Diagnosis not present

## 2019-07-28 DIAGNOSIS — I10 Essential (primary) hypertension: Secondary | ICD-10-CM | POA: Diagnosis not present

## 2019-07-28 DIAGNOSIS — J309 Allergic rhinitis, unspecified: Secondary | ICD-10-CM | POA: Diagnosis not present

## 2019-07-28 DIAGNOSIS — R0602 Shortness of breath: Secondary | ICD-10-CM | POA: Diagnosis not present

## 2019-07-28 DIAGNOSIS — E785 Hyperlipidemia, unspecified: Secondary | ICD-10-CM | POA: Diagnosis not present

## 2019-07-31 NOTE — Assessment & Plan Note (Signed)
Will continue d/c of coreg as she feels it's causing side effects. Home BPs since d/c have been stable and under good control. Continue to monitor home readings closely, call with persistent abnormal readings prior to return OV

## 2019-07-31 NOTE — Assessment & Plan Note (Signed)
Resolving but lingering fatigue. Will provide return to work note as requested and continue to monitor

## 2019-08-02 DIAGNOSIS — E669 Obesity, unspecified: Secondary | ICD-10-CM | POA: Diagnosis not present

## 2019-08-02 DIAGNOSIS — J309 Allergic rhinitis, unspecified: Secondary | ICD-10-CM | POA: Diagnosis not present

## 2019-08-02 DIAGNOSIS — E785 Hyperlipidemia, unspecified: Secondary | ICD-10-CM | POA: Diagnosis not present

## 2019-08-02 DIAGNOSIS — I10 Essential (primary) hypertension: Secondary | ICD-10-CM | POA: Diagnosis not present

## 2019-08-02 DIAGNOSIS — U071 COVID-19: Secondary | ICD-10-CM | POA: Diagnosis not present

## 2019-08-02 DIAGNOSIS — R0602 Shortness of breath: Secondary | ICD-10-CM | POA: Diagnosis not present

## 2019-08-05 DIAGNOSIS — J309 Allergic rhinitis, unspecified: Secondary | ICD-10-CM | POA: Diagnosis not present

## 2019-08-05 DIAGNOSIS — I10 Essential (primary) hypertension: Secondary | ICD-10-CM | POA: Diagnosis not present

## 2019-08-05 DIAGNOSIS — E785 Hyperlipidemia, unspecified: Secondary | ICD-10-CM | POA: Diagnosis not present

## 2019-08-05 DIAGNOSIS — U071 COVID-19: Secondary | ICD-10-CM | POA: Diagnosis not present

## 2019-08-05 DIAGNOSIS — R0602 Shortness of breath: Secondary | ICD-10-CM | POA: Diagnosis not present

## 2019-08-05 DIAGNOSIS — E669 Obesity, unspecified: Secondary | ICD-10-CM | POA: Diagnosis not present

## 2019-08-12 DIAGNOSIS — U071 COVID-19: Secondary | ICD-10-CM | POA: Diagnosis not present

## 2019-08-12 DIAGNOSIS — E785 Hyperlipidemia, unspecified: Secondary | ICD-10-CM | POA: Diagnosis not present

## 2019-08-12 DIAGNOSIS — E669 Obesity, unspecified: Secondary | ICD-10-CM | POA: Diagnosis not present

## 2019-08-12 DIAGNOSIS — J309 Allergic rhinitis, unspecified: Secondary | ICD-10-CM | POA: Diagnosis not present

## 2019-08-12 DIAGNOSIS — R0602 Shortness of breath: Secondary | ICD-10-CM | POA: Diagnosis not present

## 2019-08-12 DIAGNOSIS — I10 Essential (primary) hypertension: Secondary | ICD-10-CM | POA: Diagnosis not present

## 2019-08-20 DIAGNOSIS — U071 COVID-19: Secondary | ICD-10-CM | POA: Diagnosis not present

## 2019-08-24 ENCOUNTER — Other Ambulatory Visit: Payer: Self-pay

## 2019-08-24 ENCOUNTER — Encounter: Payer: Self-pay | Admitting: Family Medicine

## 2019-08-24 ENCOUNTER — Ambulatory Visit (INDEPENDENT_AMBULATORY_CARE_PROVIDER_SITE_OTHER): Payer: Medicare Other | Admitting: Family Medicine

## 2019-08-24 VITALS — BP 128/87 | HR 97 | Temp 98.0°F | Wt 288.0 lb

## 2019-08-24 DIAGNOSIS — I10 Essential (primary) hypertension: Secondary | ICD-10-CM | POA: Diagnosis not present

## 2019-08-24 DIAGNOSIS — K219 Gastro-esophageal reflux disease without esophagitis: Secondary | ICD-10-CM

## 2019-08-24 DIAGNOSIS — R5383 Other fatigue: Secondary | ICD-10-CM

## 2019-08-24 DIAGNOSIS — E7800 Pure hypercholesterolemia, unspecified: Secondary | ICD-10-CM

## 2019-08-24 DIAGNOSIS — R7301 Impaired fasting glucose: Secondary | ICD-10-CM

## 2019-08-24 DIAGNOSIS — J3089 Other allergic rhinitis: Secondary | ICD-10-CM | POA: Diagnosis not present

## 2019-08-24 NOTE — Assessment & Plan Note (Signed)
Stable and well controlled, continue current regimen 

## 2019-08-24 NOTE — Assessment & Plan Note (Signed)
Stable and well controlled off medication, continue to monitor closely and work on lifestyle modifications for good control

## 2019-08-24 NOTE — Assessment & Plan Note (Signed)
Recheck labs, adjust as needed. Continue current regimen

## 2019-08-24 NOTE — Assessment & Plan Note (Signed)
Recheck A1C, continue working on diet and exercise changes for improved control.

## 2019-08-24 NOTE — Progress Notes (Signed)
BP 128/87   Pulse 97   Temp 98 F (36.7 C) (Oral)   Wt 288 lb (130.6 kg)   LMP  (LMP Unknown)   SpO2 94%   BMI 47.20 kg/m    Subjective:    Patient ID: Hannah Edwards, female    DOB: 06/18/1950, 69 y.o.   MRN: [id number]  HPI: Hannah Edwards is a 69 y.o. female  Chief Complaint  Patient presents with  . Hypertension  . Gastroesophageal Reflux   Presenting today for 6 month fu chronic conditions.   HTN - Home readings have been around 116/80s. HRs have been around the 90s range. Continues off coreg due to side effects. Feeling well without CP, SOB, palpitations.   IFG - diet doing fairly well, not exercising due to recent COVID hospitalization and subsequent fatigue and weakness. Just started back to work this week which has been tough with the lingering sxs.   GERD - prilosec prn working well for her. No new concerns.   Relevant past medical, surgical, family and social history reviewed and updated as indicated. Interim medical history since our last visit reviewed. Allergies and medications reviewed and updated.  Review of Systems  Per HPI unless specifically indicated above     Objective:    BP 128/87   Pulse 97   Temp 98 F (36.7 C) (Oral)   Wt 288 lb (130.6 kg)   LMP  (LMP Unknown)   SpO2 94%   BMI 47.20 kg/m   Wt Readings from Last 3 Encounters:  08/24/19 288 lb (130.6 kg)  07/27/19 287 lb (130.2 kg)  06/30/19 282 lb 10.1 oz (128.2 kg)    Physical Exam Vitals and nursing note reviewed.  Constitutional:      Appearance: Normal appearance. She is not ill-appearing.  HENT:     Head: Atraumatic.  Eyes:     Extraocular Movements: Extraocular movements intact.     Conjunctiva/sclera: Conjunctivae normal.  Cardiovascular:     Rate and Rhythm: Normal rate and regular rhythm.     Heart sounds: Normal heart sounds.  Pulmonary:     Effort: Pulmonary effort is normal.     Breath sounds: Normal breath sounds.  Musculoskeletal:        General: Normal range of  motion.     Cervical back: Normal range of motion and neck supple.  Skin:    General: Skin is warm and dry.  Neurological:     Mental Status: She is alert and oriented to person, place, and time.  Psychiatric:        Mood and Affect: Mood normal.        Thought Content: Thought content normal.        Judgment: Judgment normal.    Results for orders placed or performed during the hospital encounter of 06/30/19  HIV Antibody (routine testing w rflx)  Result Value Ref Range   HIV Screen 4th Generation wRfx NON REACTIVE NON REACTIVE  CBC with Differential/Platelet  Result Value Ref Range   WBC 5.9 4.0 - 10.5 K/uL   RBC 4.42 3.87 - 5.11 MIL/uL   Hemoglobin 13.0 12.0 - 15.0 g/dL   HCT 15.1 76.1 - 60.7 %   MCV 91.6 80.0 - 100.0 fL   MCH 29.4 26.0 - 34.0 pg   MCHC 32.1 30.0 - 36.0 g/dL   RDW 37.1 06.2 - 69.4 %   Platelets 309 150 - 400 K/uL   nRBC 0.0 0.0 - 0.2 %   Neutrophils Relative %  71 %   Neutro Abs 4.2 1.7 - 7.7 K/uL   Lymphocytes Relative 23 %   Lymphs Abs 1.4 0.7 - 4.0 K/uL   Monocytes Relative 5 %   Monocytes Absolute 0.3 0.1 - 1.0 K/uL   Eosinophils Relative 0 %   Eosinophils Absolute 0.0 0.0 - 0.5 K/uL   Basophils Relative 0 %   Basophils Absolute 0.0 0.0 - 0.1 K/uL   Immature Granulocytes 1 %   Abs Immature Granulocytes 0.03 0.00 - 0.07 K/uL  Comprehensive metabolic panel  Result Value Ref Range   Sodium 139 135 - 145 mmol/L   Potassium 4.6 3.5 - 5.1 mmol/L   Chloride 106 98 - 111 mmol/L   CO2 26 22 - 32 mmol/L   Glucose, Bld 145 (H) 70 - 99 mg/dL   BUN 15 8 - 23 mg/dL   Creatinine, Ser 5.39 0.44 - 1.00 mg/dL   Calcium 8.5 (L) 8.9 - 10.3 mg/dL   Total Protein 7.2 6.5 - 8.1 g/dL   Albumin 3.4 (L) 3.5 - 5.0 g/dL   AST 23 15 - 41 U/L   ALT 16 0 - 44 U/L   Alkaline Phosphatase 49 38 - 126 U/L   Total Bilirubin 0.6 0.3 - 1.2 mg/dL   GFR calc non Af Amer >60 >60 mL/min   GFR calc Af Amer >60 >60 mL/min   Anion gap 7 5 - 15  C-reactive protein  Result Value  Ref Range   CRP 3.2 (H) <1.0 mg/dL  D-dimer, quantitative (not at [facility])  Result Value Ref Range   D-Dimer, Quant 0.34 0.00 - 0.50 ug/mL-FEU  Magnesium  Result Value Ref Range   Magnesium 2.2 1.7 - 2.4 mg/dL  Ferritin  Result Value Ref Range   Ferritin 191 11 - 307 ng/mL  CBC with Differential/Platelet  Result Value Ref Range   WBC 6.0 4.0 - 10.5 K/uL   RBC 4.43 3.87 - 5.11 MIL/uL   Hemoglobin 12.9 12.0 - 15.0 g/dL   HCT 76.7 34.1 - 93.7 %   MCV 91.6 80.0 - 100.0 fL   MCH 29.1 26.0 - 34.0 pg   MCHC 31.8 30.0 - 36.0 g/dL   RDW 90.2 40.9 - 73.5 %   Platelets 332 150 - 400 K/uL   nRBC 0.0 0.0 - 0.2 %   Neutrophils Relative % 74 %   Neutro Abs 4.5 1.7 - 7.7 K/uL   Lymphocytes Relative 22 %   Lymphs Abs 1.3 0.7 - 4.0 K/uL   Monocytes Relative 3 %   Monocytes Absolute 0.2 0.1 - 1.0 K/uL   Eosinophils Relative 0 %   Eosinophils Absolute 0.0 0.0 - 0.5 K/uL   Basophils Relative 0 %   Basophils Absolute 0.0 0.0 - 0.1 K/uL   Immature Granulocytes 1 %   Abs Immature Granulocytes 0.03 0.00 - 0.07 K/uL  Comprehensive metabolic panel  Result Value Ref Range   Sodium 140 135 - 145 mmol/L   Potassium 4.6 3.5 - 5.1 mmol/L   Chloride 107 98 - 111 mmol/L   CO2 25 22 - 32 mmol/L   Glucose, Bld 168 (H) 70 - 99 mg/dL   BUN 18 8 - 23 mg/dL   Creatinine, Ser 3.29 0.44 - 1.00 mg/dL   Calcium 8.8 (L) 8.9 - 10.3 mg/dL   Total Protein 7.2 6.5 - 8.1 g/dL   Albumin 3.4 (L) 3.5 - 5.0 g/dL   AST 21 15 - 41 U/L   ALT 21 0 - 44  U/L   Alkaline Phosphatase 48 38 - 126 U/L   Total Bilirubin 0.5 0.3 - 1.2 mg/dL   GFR calc non Af Amer >60 >60 mL/min   GFR calc Af Amer >60 >60 mL/min   Anion gap 8 5 - 15  C-reactive protein  Result Value Ref Range   CRP 1.4 (H) <1.0 mg/dL  D-dimer, quantitative (not at [facility])  Result Value Ref Range   D-Dimer, Quant 0.29 0.00 - 0.50 ug/mL-FEU  Ferritin  Result Value Ref Range   Ferritin 200 11 - 307 ng/mL      Assessment & Plan:   Problem List Items Addressed  This Visit      Cardiovascular and Mediastinum   Essential hypertension - Primary    Stable and well controlled off medication, continue to monitor closely and work on lifestyle modifications for good control      Relevant Orders   Comprehensive metabolic panel     Respiratory   Allergic rhinitis    Stable and well controlled, continue current regimen        Digestive   GERD (gastroesophageal reflux disease)    Stable on prn prilosec, continue current regimen        Endocrine   IFG (impaired fasting glucose)    Recheck A1C, continue working on diet and exercise changes for improved control.       Relevant Orders   HgB A1c     Other   Hyperlipidemia    Recheck labs, adjust as needed. Continue current regimen      Relevant Orders   Lipid Panel w/o Chol/HDL Ratio    Other Visit Diagnoses    Fatigue, unspecified type       Ongoing from COVID infection earlier this year. Continue to monitor       Follow up plan: Return in about 6 months (around 02/23/2020) for 6 month f/u.

## 2019-08-24 NOTE — Assessment & Plan Note (Signed)
Stable on prn prilosec, continue current regimen 

## 2019-08-25 LAB — COMPREHENSIVE METABOLIC PANEL
ALT: 14 IU/L (ref 0–32)
AST: 16 IU/L (ref 0–40)
Albumin/Globulin Ratio: 1.6 (ref 1.2–2.2)
Albumin: 3.9 g/dL (ref 3.8–4.8)
Alkaline Phosphatase: 71 IU/L (ref 39–117)
BUN/Creatinine Ratio: 13 (ref 12–28)
BUN: 10 mg/dL (ref 8–27)
Bilirubin Total: 0.3 mg/dL (ref 0.0–1.2)
CO2: 26 mmol/L (ref 20–29)
Calcium: 9.4 mg/dL (ref 8.7–10.3)
Chloride: 102 mmol/L (ref 96–106)
Creatinine, Ser: 0.76 mg/dL (ref 0.57–1.00)
GFR calc Af Amer: 92 mL/min/{1.73_m2} (ref >59)
GFR calc non Af Amer: 80 mL/min/{1.73_m2} (ref >59)
Globulin, Total: 2.4 g/dL (ref 1.5–4.5)
Glucose: 137 mg/dL — ABNORMAL HIGH (ref 65–99)
Potassium: 4 mmol/L (ref 3.5–5.2)
Sodium: 141 mmol/L (ref 134–144)
Total Protein: 6.3 g/dL (ref 6.0–8.5)

## 2019-08-25 LAB — LIPID PANEL W/O CHOL/HDL RATIO
Cholesterol, Total: 192 mg/dL (ref 100–199)
HDL: 43 mg/dL (ref >39)
LDL Chol Calc (NIH): 129 mg/dL — ABNORMAL HIGH (ref 0–99)
Triglycerides: 111 mg/dL (ref 0–149)
VLDL Cholesterol Cal: 20 mg/dL (ref 5–40)

## 2019-08-25 LAB — HEMOGLOBIN A1C
Est. average glucose Bld gHb Est-mCnc: 134 mg/dL
Hgb A1c MFr Bld: 6.3 % — ABNORMAL HIGH (ref 4.8–5.6)

## 2019-09-07 ENCOUNTER — Telehealth: Payer: Self-pay | Admitting: Family Medicine

## 2019-09-07 NOTE — Telephone Encounter (Signed)
Called pt, no answer left vm to return call   Copied from CRM [phone]. Topic: General - Other >> Sep 07, 2019  2:56 PM [name] wrote: Reason for CRM: Pt stated she received a letter addressed to Hannah Edwards. Pt wanted to make office aware so the letter can be sent to the correct pt

## 2019-09-22 ENCOUNTER — Telehealth: Payer: Self-pay | Admitting: Family Medicine

## 2019-09-22 NOTE — Telephone Encounter (Signed)
Is this something that we would do or does it need to come from the infusion clinic?

## 2019-09-22 NOTE — Telephone Encounter (Signed)
She will need to discuss this with the infusion clinic as this is where it sounds like the information came from

## 2019-09-22 NOTE — Telephone Encounter (Signed)
Called pt advised of Rachel's message pt verbalized understanding.

## 2019-09-22 NOTE — Telephone Encounter (Signed)
Copied from CRM [id number]. Topic: General - Call Back - No Documentation >> Sep 22, 2019 10:57 AM Randol Kern wrote: Reason for CRM: Pt called and needs an urgent call back to discuss a document that she needs for work. Pt was admitted into the hospital and received five infusions of rendezovir. She was told that she only needs one vaccine shot and not two. Her employers want documentation from her PCP. I directed her to the infusion clinic line [phone] Best contact: [phone]

## 2019-10-05 ENCOUNTER — Other Ambulatory Visit: Payer: Self-pay

## 2020-01-18 ENCOUNTER — Ambulatory Visit: Payer: Medicare Other | Admitting: Family Medicine

## 2020-02-29 ENCOUNTER — Encounter: Payer: Self-pay | Admitting: Family Medicine

## 2020-02-29 ENCOUNTER — Ambulatory Visit: Payer: Medicare Other | Admitting: Family Medicine

## 2020-02-29 ENCOUNTER — Other Ambulatory Visit: Payer: Self-pay

## 2020-02-29 ENCOUNTER — Ambulatory Visit (INDEPENDENT_AMBULATORY_CARE_PROVIDER_SITE_OTHER): Payer: Medicare Other | Admitting: Family Medicine

## 2020-02-29 VITALS — BP 133/85 | HR 83 | Temp 98.5°F | Wt 285.6 lb

## 2020-02-29 DIAGNOSIS — I10 Essential (primary) hypertension: Secondary | ICD-10-CM

## 2020-02-29 DIAGNOSIS — R609 Edema, unspecified: Secondary | ICD-10-CM

## 2020-02-29 DIAGNOSIS — E7800 Pure hypercholesterolemia, unspecified: Secondary | ICD-10-CM

## 2020-02-29 DIAGNOSIS — K219 Gastro-esophageal reflux disease without esophagitis: Secondary | ICD-10-CM

## 2020-02-29 DIAGNOSIS — Z23 Encounter for immunization: Secondary | ICD-10-CM

## 2020-02-29 DIAGNOSIS — Z1211 Encounter for screening for malignant neoplasm of colon: Secondary | ICD-10-CM | POA: Diagnosis not present

## 2020-02-29 DIAGNOSIS — Z1231 Encounter for screening mammogram for malignant neoplasm of breast: Secondary | ICD-10-CM

## 2020-02-29 DIAGNOSIS — R7301 Impaired fasting glucose: Secondary | ICD-10-CM | POA: Diagnosis not present

## 2020-02-29 LAB — MICROALBUMIN, URINE WAIVED
Creatinine, Urine Waived: 200 mg/dL (ref 10–300)
Microalb, Ur Waived: 30 mg/L — ABNORMAL HIGH (ref 0–19)
Microalb/Creat Ratio: <30 mg/g (ref <30)

## 2020-02-29 LAB — BAYER DCA HB A1C WAIVED: HB A1C (BAYER DCA - WAIVED): 6.2 % (ref <7.0)

## 2020-02-29 MED ORDER — OMEPRAZOLE 20 MG PO CPDR: 20.0000 mg | DELAYED_RELEASE_CAPSULE | Freq: Every day | ORAL | 3 refills | Status: DC

## 2020-02-29 NOTE — Assessment & Plan Note (Signed)
Doing well with A1c of 6.2. Continue to monitor. Call with any concerns.  

## 2020-02-29 NOTE — Assessment & Plan Note (Signed)
Encouraged diet and exercise with goal of losing 1-2lbs per week. Call with any concerns.  

## 2020-02-29 NOTE — Assessment & Plan Note (Signed)
Under good control off medicine. Continue to monitor. Call with any concerns.  

## 2020-02-29 NOTE — Assessment & Plan Note (Signed)
Rechecking levels today. Await results. Treat as needed.  

## 2020-02-29 NOTE — Patient Instructions (Signed)

## 2020-02-29 NOTE — Progress Notes (Addendum)
BP 133/85   Pulse 83   Temp 98.5 F (36.9 C) (Oral)   Wt 285 lb 9.6 oz (129.5 kg)   LMP  (LMP Unknown)   SpO2 95%   BMI 46.80 kg/m    Subjective:    Patient ID: Hannah Edwards, female    DOB: Feb 27, 1951, 69 y.o.   MRN: [id number]  HPI: Hannah Edwards is a 69 y.o. female  Chief Complaint  Patient presents with  . Hypertension  . Gastroesophageal Reflux  . Hyperlipidemia   L ankle has been swollen on and off for the past 6 months, but for the last week it's been worse.  No redness, no heat. No SOB.   HYPERTENSION / HYPERLIPIDEMIA Satisfied with current treatment? yes Duration of hypertension: chronic BP monitoring frequency: not checking BP medication side effects: not on anything Duration of hyperlipidemia: chronic Cholesterol medication side effects: not on anything Cholesterol supplements: none Past cholesterol medications: atorvastatin Medication compliance: not on anything Aspirin: no Recent stressors: no Recurrent headaches: no Visual changes: no Palpitations: no Dyspnea: no Chest pain: no Lower extremity edema: no Dizzy/lightheaded: no  Impaired Fasting Glucose HbA1C:  Lab Results  Component Value Date   HGBA1C 6.3 (H) 08/24/2019   Duration of elevated blood sugar: chronic Polydipsia: no Polyuria: no Weight change: no Visual disturbance: no Glucose Monitoring: no Diabetic Education: Not Completed Family history of diabetes: yes  GERD GERD control status: controlled  Satisfied with current treatment? yes Heartburn frequency: never on medicine Medication side effects: no  Medication compliance: stable Dysphagia: no Odynophagia:  no Hematemesis: no Blood in stool: no EGD: no   Relevant past medical, surgical, family and social history reviewed and updated as indicated. Interim medical history since our last visit reviewed. Allergies and medications reviewed and updated.  Review of Systems  Constitutional: Negative.   Respiratory: Negative.     Cardiovascular: Positive for leg swelling. Negative for chest pain and palpitations.  Gastrointestinal: Negative.   Musculoskeletal: Negative.   Neurological: Negative.   Psychiatric/Behavioral: Negative.     Per HPI unless specifically indicated above     Objective:    BP 133/85   Pulse 83   Temp 98.5 F (36.9 C) (Oral)   Wt 285 lb 9.6 oz (129.5 kg)   LMP  (LMP Unknown)   SpO2 95%   BMI 46.80 kg/m   Wt Readings from Last 3 Encounters:  02/29/20 285 lb 9.6 oz (129.5 kg)  08/24/19 288 lb (130.6 kg)  07/27/19 287 lb (130.2 kg)    Physical Exam Vitals and nursing note reviewed.  Constitutional:      General: She is not in acute distress.    Appearance: Normal appearance. She is not ill-appearing, toxic-appearing or diaphoretic.  HENT:     Head: Normocephalic and atraumatic.     Right Ear: External ear normal.     Left Ear: External ear normal.     Nose: Nose normal.     Mouth/Throat:     Mouth: Mucous membranes are moist.     Pharynx: Oropharynx is clear.  Eyes:     General: No scleral icterus.       Right eye: No discharge.        Left eye: No discharge.     Extraocular Movements: Extraocular movements intact.     Conjunctiva/sclera: Conjunctivae normal.     Pupils: Pupils are equal, round, and reactive to light.  Cardiovascular:     Rate and Rhythm: Normal rate and regular rhythm.  Pulses: Normal pulses.     Heart sounds: Normal heart sounds. No murmur heard.  No friction rub. No gallop.   Pulmonary:     Effort: Pulmonary effort is normal. No respiratory distress.     Breath sounds: Normal breath sounds. No stridor. No wheezing, rhonchi or rales.  Chest:     Chest wall: No tenderness.  Musculoskeletal:        General: Normal range of motion.     Cervical back: Normal range of motion and neck supple.  Skin:    General: Skin is warm and dry.     Capillary Refill: Capillary refill takes less than 2 seconds.     Coloration: Skin is not jaundiced or pale.      Findings: No bruising, erythema, lesion or rash.  Neurological:     General: No focal deficit present.     Mental Status: She is alert and oriented to person, place, and time. Mental status is at baseline.  Psychiatric:        Mood and Affect: Mood normal.        Behavior: Behavior normal.        Thought Content: Thought content normal.        Judgment: Judgment normal.     Results for orders placed or performed in visit on 08/24/19  Comprehensive metabolic panel  Result Value Ref Range   Glucose 137 (H) 65 - 99 mg/dL   BUN 10 8 - 27 mg/dL   Creatinine, Ser 0.94 0.57 - 1.00 mg/dL   GFR calc non Af Amer 80 >59 mL/min/1.73   GFR calc Af Amer 92 >59 mL/min/1.73   BUN/Creatinine Ratio 13 12 - 28   Sodium 141 134 - 144 mmol/L   Potassium 4.0 3.5 - 5.2 mmol/L   Chloride 102 96 - 106 mmol/L   CO2 26 20 - 29 mmol/L   Calcium 9.4 8.7 - 10.3 mg/dL   Total Protein 6.3 6.0 - 8.5 g/dL   Albumin 3.9 3.8 - 4.8 g/dL   Globulin, Total 2.4 1.5 - 4.5 g/dL   Albumin/Globulin Ratio 1.6 1.2 - 2.2   Bilirubin Total 0.3 0.0 - 1.2 mg/dL   Alkaline Phosphatase 71 39 - 117 IU/L   AST 16 0 - 40 IU/L   ALT 14 0 - 32 IU/L  Lipid Panel w/o Chol/HDL Ratio  Result Value Ref Range   Cholesterol, Total 192 100 - 199 mg/dL   Triglycerides 709 0 - 149 mg/dL   HDL 43 >62 mg/dL   VLDL Cholesterol Cal 20 5 - 40 mg/dL   LDL Chol Calc (NIH) 836 (H) 0 - 99 mg/dL  HgB O2H  Result Value Ref Range   Hgb A1c MFr Bld 6.3 (H) 4.8 - 5.6 %   Est. average glucose Bld gHb Est-mCnc 134 mg/dL      Assessment & Plan:   Problem List Items Addressed This Visit      Cardiovascular and Mediastinum   Essential hypertension - Primary    Under good control off medicine. Continue to monitor. Call with any concerns.       Relevant Orders   Comprehensive metabolic panel   Microalbumin, Urine Waived     Digestive   GERD (gastroesophageal reflux disease)    Under good control on current regimen. Continue current  regimen. Continue to monitor. Call with any concerns. Refills given. Labs drawn today.       Relevant Medications   omeprazole (PRILOSEC) 20 MG capsule  Other Relevant Orders   CBC with Differential/Platelet   Comprehensive metabolic panel     Endocrine   IFG (impaired fasting glucose)    Doing well with A1c of 6.2. Continue to monitor. Call with any concerns.       Relevant Orders   Bayer DCA Hb A1c Waived   Comprehensive metabolic panel     Other   Morbid obesity (HCC)    Encouraged diet and exercise with goal of losing 1-2lbs per week. Call with any concerns.       Hyperlipidemia    Rechecking levels today. Await results. Treat as needed.       Relevant Orders   Comprehensive metabolic panel   Lipid Panel w/o Chol/HDL Ratio    Other Visit Diagnoses    Peripheral edema       Will start compression hose. Call with any concerns. Continue to monitor.    Encounter for screening mammogram for malignant neoplasm of breast       Mammogram ordered today.    Relevant Orders   MM 3D SCREEN BREAST BILATERAL   Screening for colon cancer       Cologuard ordered today.   Relevant Orders   Cologuard   Need for influenza vaccination       Flu shot given today.   Relevant Orders   Flu Vaccine QUAD High Dose(Fluad) (Completed)       Follow up plan: Return in about 6 months (around 08/29/2020) for physical- please add her to mammogram truck list too.

## 2020-02-29 NOTE — Assessment & Plan Note (Signed)
Under good control on current regimen. Continue current regimen. Continue to monitor. Call with any concerns. Refills given. Labs drawn today.   

## 2020-03-01 LAB — CBC WITH DIFFERENTIAL/PLATELET
Basophils Absolute: 0.1 10*3/uL (ref 0.0–0.2)
Basos: 1 %
EOS (ABSOLUTE): 0.2 10*3/uL (ref 0.0–0.4)
Eos: 3 %
Hematocrit: 39.7 % (ref 34.0–46.6)
Hemoglobin: 13 g/dL (ref 11.1–15.9)
Immature Grans (Abs): 0 10*3/uL (ref 0.0–0.1)
Immature Granulocytes: 0 %
Lymphocytes Absolute: 2.2 10*3/uL (ref 0.7–3.1)
Lymphs: 38 %
MCH: 29.7 pg (ref 26.6–33.0)
MCHC: 32.7 g/dL (ref 31.5–35.7)
MCV: 91 fL (ref 79–97)
Monocytes Absolute: 0.5 10*3/uL (ref 0.1–0.9)
Monocytes: 8 %
Neutrophils Absolute: 3 10*3/uL (ref 1.4–7.0)
Neutrophils: 50 %
Platelets: 312 10*3/uL (ref 150–450)
RBC: 4.38 x10E6/uL (ref 3.77–5.28)
RDW: 13.6 % (ref 11.7–15.4)
WBC: 5.9 10*3/uL (ref 3.4–10.8)

## 2020-03-01 LAB — COMPREHENSIVE METABOLIC PANEL
ALT: 11 IU/L (ref 0–32)
AST: 14 IU/L (ref 0–40)
Albumin/Globulin Ratio: 1.5 (ref 1.2–2.2)
Albumin: 4.1 g/dL (ref 3.8–4.8)
Alkaline Phosphatase: 84 IU/L (ref 44–121)
BUN/Creatinine Ratio: 13 (ref 12–28)
BUN: 11 mg/dL (ref 8–27)
Bilirubin Total: 0.4 mg/dL (ref 0.0–1.2)
CO2: 27 mmol/L (ref 20–29)
Calcium: 9.5 mg/dL (ref 8.7–10.3)
Chloride: 101 mmol/L (ref 96–106)
Creatinine, Ser: 0.85 mg/dL (ref 0.57–1.00)
GFR calc Af Amer: 80 mL/min/{1.73_m2} (ref >59)
GFR calc non Af Amer: 70 mL/min/{1.73_m2} (ref >59)
Globulin, Total: 2.8 g/dL (ref 1.5–4.5)
Glucose: 92 mg/dL (ref 65–99)
Potassium: 4.8 mmol/L (ref 3.5–5.2)
Sodium: 140 mmol/L (ref 134–144)
Total Protein: 6.9 g/dL (ref 6.0–8.5)

## 2020-03-01 LAB — LIPID PANEL W/O CHOL/HDL RATIO
Cholesterol, Total: 199 mg/dL (ref 100–199)
HDL: 49 mg/dL (ref >39)
LDL Chol Calc (NIH): 137 mg/dL — ABNORMAL HIGH (ref 0–99)
Triglycerides: 73 mg/dL (ref 0–149)
VLDL Cholesterol Cal: 13 mg/dL (ref 5–40)

## 2020-03-02 ENCOUNTER — Encounter: Payer: Self-pay | Admitting: Family Medicine

## 2020-03-02 NOTE — Progress Notes (Signed)
.   Hannah Edwards,

## 2020-03-21 ENCOUNTER — Other Ambulatory Visit: Payer: Self-pay | Admitting: Family Medicine

## 2020-03-21 DIAGNOSIS — Z1231 Encounter for screening mammogram for malignant neoplasm of breast: Secondary | ICD-10-CM

## 2020-04-03 ENCOUNTER — Ambulatory Visit
Admission: RE | Admit: 2020-04-03 | Discharge: 2020-04-03 | Disposition: A | Discharge status: Home / Self Care | Payer: Medicare Other | Source: Ambulatory Visit | Attending: Family Medicine | Admitting: Family Medicine

## 2020-04-03 ENCOUNTER — Other Ambulatory Visit: Payer: Self-pay

## 2020-04-03 DIAGNOSIS — Z1231 Encounter for screening mammogram for malignant neoplasm of breast: Secondary | ICD-10-CM

## 2020-04-09 ENCOUNTER — Other Ambulatory Visit: Payer: Self-pay | Admitting: Family Medicine

## 2020-04-09 DIAGNOSIS — R928 Other abnormal and inconclusive findings on diagnostic imaging of breast: Secondary | ICD-10-CM

## 2020-06-14 ENCOUNTER — Telehealth: Payer: Self-pay

## 2020-06-14 NOTE — Telephone Encounter (Signed)
Letter has been generated and will be mailed to patient.

## 2020-06-14 NOTE — Telephone Encounter (Signed)
Called patient to advise of letter we received regarding recent mammogram done in November. Left message with phone number to call and schedule and future imaging needed according to letter we received.

## 2020-06-27 ENCOUNTER — Other Ambulatory Visit: Payer: Medicare Other

## 2020-07-09 ENCOUNTER — Ambulatory Visit (INDEPENDENT_AMBULATORY_CARE_PROVIDER_SITE_OTHER): Payer: Medicare Other

## 2020-07-09 VITALS — Ht 65.0 in | Wt 279.0 lb

## 2020-07-09 DIAGNOSIS — Z0000 Encounter for general adult medical examination without abnormal findings: Secondary | ICD-10-CM | POA: Diagnosis not present

## 2020-07-09 NOTE — Progress Notes (Signed)
I connected with Hannah Edwards today by telephone and verified that I am speaking with the correct person using two identifiers. Location patient: home Location provider: work Persons participating in the virtual visit: Jianna Drabik, Elisha Ponder LPN.   I discussed the limitations, risks, security and privacy concerns of performing an evaluation and management service by telephone and the availability of in person appointments. I also discussed with the patient that there may be a patient responsible charge related to this service. The patient expressed understanding and verbally consented to this telephonic visit.    Interactive audio and video telecommunications were attempted between this provider and patient, however failed, due to patient having technical difficulties OR patient did not have access to video capability.  We continued and completed visit with audio only.     Vital signs may be patient reported or missing.  Subjective:   Hannah Edwards is a 70 y.o. female who presents for Medicare Annual (Subsequent) preventive examination.  Review of Systems     Cardiac Risk Factors include: advanced age (>28men, >72 women);dyslipidemia;hypertension;obesity (BMI >30kg/m2);sedentary lifestyle     Objective:    Today's Vitals   07/09/20 1256  Weight: 279 lb (126.6 kg)  Height: 5\' 5"  (1.651 m)   Body mass index is 46.43 kg/m.  Advanced Directives 07/09/2020 07/04/2019 06/30/2019 06/29/2019 01/13/2018 12/10/2016 05/11/2015  Does Patient Have a Medical Advance Directive? Yes No No No No No No  Type of Advance Directive Living will - - - - - -  Would patient like information on creating a medical advance directive? - - No - Patient declined - Yes (MAU/Ambulatory/Procedural Areas - Information given) Yes (ED - Information included in AVS) -    Current Medications (verified) Outpatient Encounter Medications as of 07/09/2020  Medication Sig  . acetaminophen (TYLENOL) 500 MG tablet Take 1,000 mg by  mouth every 6 (six) hours as needed.  09/08/2020 omeprazole (PRILOSEC) 20 MG capsule Take 1 capsule (20 mg total) by mouth daily.   No facility-administered encounter medications on file as of 07/09/2020.    Allergies (verified) Ace inhibitors, Lisinopril, and Atorvastatin   History: History reviewed. No pertinent past medical history. Past Surgical History:  Procedure Laterality Date  . ABDOMINAL HYSTERECTOMY    . WRIST SURGERY Left    Family History  Problem Relation Age of Onset  . Hypertension Mother   . Cancer Mother        unknown type   . Hypertension Father   . Aneurysm Father   . Stomach cancer Sister   . Diabetes Brother   . HIV/AIDS Brother   . Breast cancer Neg Hx   . Lung cancer Neg Hx    Social History   Socioeconomic History  . Marital status: Widowed    Spouse name: Not on file  . Number of children: Not on file  . Years of education: Not on file  . Highest education level: Bachelor's degree (e.g., BA, AB, BS)  Occupational History  . Not on file  Tobacco Use  . Smoking status: Former Smoker    Types: Cigarettes    Quit date: 01/05/1975    Years since quitting: 45.5  . Smokeless tobacco: Never Used  Vaping Use  . Vaping Use: Never used  Substance and Sexual Activity  . Alcohol use: No    Alcohol/week: 0.0 standard drinks  . Drug use: No  . Sexual activity: Never  Other Topics Concern  . Not on file  Social History Narrative   Working  full time    Social Determinants of Health   Financial Resource Strain: Low Risk   . Difficulty of Paying Living Expenses: Not hard at all  Food Insecurity: No Food Insecurity  . Worried About Programme researcher, broadcasting/film/video in the Last Year: Never true  . Ran Out of Food in the Last Year: Never true  Transportation Needs: No Transportation Needs  . Lack of Transportation (Medical): No  . Lack of Transportation (Non-Medical): No  Physical Activity: Inactive  . Days of Exercise per Week: 0 days  . Minutes of Exercise per  Session: 0 min  Stress: No Stress Concern Present  . Feeling of Stress : Not at all  Social Connections: Not on file    Tobacco Counseling Counseling given: Not Answered   Clinical Intake:  Pre-visit preparation completed: Yes  Pain : No/denies pain     Nutritional Status: BMI > 30  Obese Nutritional Risks: None Diabetes: No  How often do you need to have someone help you when you read instructions, pamphlets, or other written materials from your doctor or pharmacy?: 1 - Never What is the last grade level you completed in school?: college  Diabetic? no  Interpreter Needed?: No  Information entered by :: NAllen LPN   Activities of Daily Living In your present state of health, do you have any difficulty performing the following activities: 07/09/2020  Hearing? N  Vision? N  Difficulty concentrating or making decisions? N  Walking or climbing stairs? N  Dressing or bathing? N  Doing errands, shopping? N  Preparing Food and eating ? N  Using the Toilet? N  In the past six months, have you accidently leaked urine? N  Do you have problems with loss of bowel control? N  Managing your Medications? N  Managing your Finances? N  Housekeeping or managing your Housekeeping? N  Some recent data might be hidden    Patient Care Team: Larae Grooms, NP as PCP - General  Indicate any recent Medical Services you may have received from other than [facility] providers in the past year (date may be approximate).     Assessment:   This is a routine wellness examination for Hannah Edwards.  Hearing/Vision screen  Hearing Screening   125Hz  250Hz  500Hz  1000Hz  2000Hz  3000Hz  4000Hz  6000Hz  8000Hz   Right ear:           Left ear:           Vision Screening Comments: No regular eye exams, [facility]  Dietary issues and exercise activities discussed: Current Exercise Habits: The patient does not participate in regular exercise at present  Goals    . DIET - INCREASE WATER INTAKE      Recommend drinking at least 6-8 glasses of water a day     . Patient Stated     07/09/2020, stay well      Depression Screen PHQ 2/9 Scores 07/09/2020 07/04/2019 01/13/2018 12/10/2016 05/11/2015  PHQ - 2 Score 0 0 0 0 0  PHQ- 9 Score - - - 0 -    Fall Risk Fall Risk  07/09/2020 07/04/2019 11/16/2018 01/13/2018 12/10/2016  Falls in the past year? 0 0 0 No No  Number falls in past yr: - 0 0 - -  Injury with Fall? - 0 0 - -  Risk for fall due to : No Fall Risks - - - -  Follow up Falls evaluation completed;Education provided;Falls prevention discussed - - - -    FALL RISK PREVENTION  PERTAINING TO THE HOME:  Any stairs in or around the home? Yes  If so, are there any without handrails? No  Home free of loose throw rugs in walkways, pet beds, electrical cords, etc? Yes  Adequate lighting in your home to reduce risk of falls? Yes   ASSISTIVE DEVICES UTILIZED TO PREVENT FALLS:  Life alert? No  Use of a cane, walker or w/c? No  Grab bars in the bathroom? No  Shower chair or bench in shower? No  Elevated toilet seat or a handicapped toilet? Yes   TIMED UP AND GO:  Was the test performed? No .    Cognitive Function:     6CIT Screen 07/09/2020 01/13/2018  What Year? 0 points 0 points  What month? 0 points 0 points  What time? 0 points 0 points  Count back from 20 0 points 0 points  Months in reverse 0 points 0 points  Repeat phrase 0 points 0 points  Total Score 0 0    Immunizations Immunization History  Administered Date(s) Administered  . Fluad Quad(high Dose 65+) 02/08/2019, 02/29/2020  . Influenza, High Dose Seasonal PF 01/13/2018  . Influenza-Unspecified 02/12/2015  . PFIZER(Purple Top)SARS-COV-2 Vaccination 09/28/2019, 10/19/2019, 04/11/2020  . PPD Test 09/09/2013  . Pneumococcal Conjugate-13 12/10/2016  . Pneumococcal Polysaccharide-23 01/13/2018  . Zoster 06/17/2010    TDAP status: Due, Education has been provided regarding the importance of this vaccine. Advised may receive  this vaccine at local pharmacy or Health Dept. Aware to provide a copy of the vaccination record if obtained from local pharmacy or Health Dept. Verbalized acceptance and understanding.  Flu Vaccine status: Up to date  Pneumococcal vaccine status: Up to date  Covid-19 vaccine status: Completed vaccines  Qualifies for Shingles Vaccine? Yes   Zostavax completed Yes   Shingrix Completed?: No.    Education has been provided regarding the importance of this vaccine. Patient has been advised to call insurance company to determine out of pocket expense if they have not yet received this vaccine. Advised may also receive vaccine at local pharmacy or Health Dept. Verbalized acceptance and understanding.  Screening Tests Health Maintenance  Topic Date Due  . Fecal DNA (Cologuard)  02/28/2020  . TETANUS/TDAP  02/28/2021 (Originally 05/22/1969)  . MAMMOGRAM  04/03/2022  . INFLUENZA VACCINE  Completed  . DEXA SCAN  Completed  . COVID-19 Vaccine  Completed  . Hepatitis C Screening  Completed  . PNA vac Low Risk Adult  Completed  . HPV VACCINES  Aged Out    Health Maintenance  Health Maintenance Due  Topic Date Due  . Fecal DNA (Cologuard)  02/28/2020    Colorectal cancer screening: Type of screening: Cologuard. Completed 02/27/2017. Repeat every 3 years  Mammogram status: Completed 04/03/2020. Repeat every year  Bone Density status: Completed 01/22/2017.   Lung Cancer Screening: (Low Dose CT Chest recommended if Age 61-80 years, 30 pack-year currently smoking OR have quit w/in 15years.) does not qualify.   Lung Cancer Screening Referral: no  Additional Screening:  Hepatitis C Screening: does qualify; Completed 05/11/2015   Vision Screening: Recommended annual ophthalmology exams for early detection of glaucoma and other disorders of the eye. Is the patient up to date with their annual eye exam?  No  Who is the provider or what is the name of the office in which the patient attends  annual eye exams? [facility] If pt is not established with a provider, would they like to be referred to a provider to  establish care? No .   Dental Screening: Recommended annual dental exams for proper oral hygiene  Community Resource Referral / Chronic Care Management: CRR required this visit?  No   CCM required this visit?  No      Plan:     I have personally reviewed and noted the following in the patient's chart:   . Medical and social history . Use of alcohol, tobacco or illicit drugs  . Current medications and supplements . Functional ability and status . Nutritional status . Physical activity . Advanced directives . List of other physicians . Hospitalizations, surgeries, and ER visits in previous 12 months . Vitals . Screenings to include cognitive, depression, and falls . Referrals and appointments  In addition, I have reviewed and discussed with patient certain preventive protocols, quality metrics, and best practice recommendations. A written personalized care plan for preventive services as well as general preventive health recommendations were provided to patient.     Barb Merino, LPN   11/02/6965   Nurse Notes:

## 2020-07-09 NOTE — Patient Instructions (Signed)
Hannah Edwards , Thank you for taking time to come for your Medicare Wellness Visit. I appreciate your ongoing commitment to your health goals. Please review the following plan we discussed and let me know if I can assist you in the future.   Screening recommendations/referrals: Colonoscopy: due Mammogram: completed 04/03/2020 Bone Density: completed 01/22/2017 Recommended yearly ophthalmology/optometry visit for glaucoma screening and checkup Recommended yearly dental visit for hygiene and checkup  Vaccinations: Influenza vaccine: completed 02/29/2020, due 12/03/2020 Pneumococcal vaccine: completed 01/13/2018 Tdap vaccine: due Shingles vaccine: discussed   Covid-19: 04/11/2020, 10/19/2019, 09/28/2019  Advanced directives: Please bring a copy of your POA (Power of Attorney) and/or Living Will to your next appointment.   Conditions/risks identified: none  Next appointment: Follow up in one year for your annual wellness visit    Preventive Care 65 Years and Older, Female Preventive care refers to lifestyle choices and visits with your health care provider that can promote health and wellness. What does preventive care include?  A yearly physical exam. This is also called an annual well check.  Dental exams once or twice a year.  Routine eye exams. Ask your health care provider how often you should have your eyes checked.  Personal lifestyle choices, including:  Daily care of your teeth and gums.  Regular physical activity.  Eating a healthy diet.  Avoiding tobacco and drug use.  Limiting alcohol use.  Practicing safe sex.  Taking low-dose aspirin every day.  Taking vitamin and mineral supplements as recommended by your health care provider. What happens during an annual well check? The services and screenings done by your health care provider during your annual well check will depend on your age, overall health, lifestyle risk factors, and family history of disease. Counseling   Your health care provider may ask you questions about your:  Alcohol use.  Tobacco use.  Drug use.  Emotional well-being.  Home and relationship well-being.  Sexual activity.  Eating habits.  History of falls.  Memory and ability to understand (cognition).  Work and work Astronomer.  Reproductive health. Screening  You may have the following tests or measurements:  Height, weight, and BMI.  Blood pressure.  Lipid and cholesterol levels. These may be checked every 5 years, or more frequently if you are over 8 years old.  Skin check.  Lung cancer screening. You may have this screening every year starting at age 72 if you have a 30-pack-year history of smoking and currently smoke or have quit within the past 15 years.  Fecal occult blood test (FOBT) of the stool. You may have this test every year starting at age 61.  Flexible sigmoidoscopy or colonoscopy. You may have a sigmoidoscopy every 5 years or a colonoscopy every 10 years starting at age 60.  Hepatitis C blood test.  Hepatitis B blood test.  Sexually transmitted disease (STD) testing.  Diabetes screening. This is done by checking your blood sugar (glucose) after you have not eaten for a while (fasting). You may have this done every 1-3 years.  Bone density scan. This is done to screen for osteoporosis. You may have this done starting at age 46.  Mammogram. This may be done every 1-2 years. Talk to your health care provider about how often you should have regular mammograms. Talk with your health care provider about your test results, treatment options, and if necessary, the need for more tests. Vaccines  Your health care provider may recommend certain vaccines, such as:  Influenza vaccine. This is recommended  every year.  Tetanus, diphtheria, and acellular pertussis (Tdap, Td) vaccine. You may need a Td booster every 10 years.  Zoster vaccine. You may need this after age 33.  Pneumococcal 13-valent  conjugate (PCV13) vaccine. One dose is recommended after age 73.  Pneumococcal polysaccharide (PPSV23) vaccine. One dose is recommended after age 70. Talk to your health care provider about which screenings and vaccines you need and how often you need them. This information is not intended to replace advice given to you by your health care provider. Make sure you discuss any questions you have with your health care provider. Document Released: 05/18/2015 Document Revised: 01/09/2016 Document Reviewed: 02/20/2015 Elsevier Interactive Patient Education  2017 [place] Prevention in the Home Falls can cause injuries. They can happen to people of all ages. There are many things you can do to make your home safe and to help prevent falls. What can I do on the outside of my home?  Regularly fix the edges of walkways and driveways and fix any cracks.  Remove anything that might make you trip as you walk through a door, such as a raised step or threshold.  Trim any bushes or trees on the path to your home.  Use bright outdoor lighting.  Clear any walking paths of anything that might make someone trip, such as rocks or tools.  Regularly check to see if handrails are loose or broken. Make sure that both sides of any steps have handrails.  Any raised decks and porches should have guardrails on the edges.  Have any leaves, snow, or ice cleared regularly.  Use sand or salt on walking paths during winter.  Clean up any spills in your garage right away. This includes oil or grease spills. What can I do in the bathroom?  Use night lights.  Install grab bars by the toilet and in the tub and shower. Do not use towel bars as grab bars.  Use non-skid mats or decals in the tub or shower.  If you need to sit down in the shower, use a plastic, non-slip stool.  Keep the floor dry. Clean up any water that spills on the floor as soon as it happens.  Remove soap buildup in the tub or  shower regularly.  Attach bath mats securely with double-sided non-slip rug tape.  Do not have throw rugs and other things on the floor that can make you trip. What can I do in the bedroom?  Use night lights.  Make sure that you have a light by your bed that is easy to reach.  Do not use any sheets or blankets that are too big for your bed. They should not hang down onto the floor.  Have a firm chair that has side arms. You can use this for support while you get dressed.  Do not have throw rugs and other things on the floor that can make you trip. What can I do in the kitchen?  Clean up any spills right away.  Avoid walking on wet floors.  Keep items that you use a lot in easy-to-reach places.  If you need to reach something above you, use a strong step stool that has a grab bar.  Keep electrical cords out of the way.  Do not use floor polish or wax that makes floors slippery. If you must use wax, use non-skid floor wax.  Do not have throw rugs and other things on the floor that can make you trip. What  can I do with my stairs?  Do not leave any items on the stairs.  Make sure that there are handrails on both sides of the stairs and use them. Fix handrails that are broken or loose. Make sure that handrails are as long as the stairways.  Check any carpeting to make sure that it is firmly attached to the stairs. Fix any carpet that is loose or worn.  Avoid having throw rugs at the top or bottom of the stairs. If you do have throw rugs, attach them to the floor with carpet tape.  Make sure that you have a light switch at the top of the stairs and the bottom of the stairs. If you do not have them, ask someone to add them for you. What else can I do to help prevent falls?  Wear shoes that:  Do not have high heels.  Have rubber bottoms.  Are comfortable and fit you well.  Are closed at the toe. Do not wear sandals.  If you use a stepladder:  Make sure that it is fully  opened. Do not climb a closed stepladder.  Make sure that both sides of the stepladder are locked into place.  Ask someone to hold it for you, if possible.  Clearly mark and make sure that you can see:  Any grab bars or handrails.  First and last steps.  Where the edge of each step is.  Use tools that help you move around (mobility aids) if they are needed. These include:  Canes.  Walkers.  Scooters.  Crutches.  Turn on the lights when you go into a dark area. Replace any light bulbs as soon as they burn out.  Set up your furniture so you have a clear path. Avoid moving your furniture around.  If any of your floors are uneven, fix them.  If there are any pets around you, be aware of where they are.  Review your medicines with your doctor. Some medicines can make you feel dizzy. This can increase your chance of falling. Ask your doctor what other things that you can do to help prevent falls. This information is not intended to replace advice given to you by your health care provider. Make sure you discuss any questions you have with your health care provider. Document Released: 02/15/2009 Document Revised: 09/27/2015 Document Reviewed: 05/26/2014 Elsevier Interactive Patient Education  2017 [employer].

## 2020-08-29 ENCOUNTER — Ambulatory Visit
Admission: RE | Admit: 2020-08-29 | Discharge: 2020-08-29 | Disposition: A | Discharge status: Home / Self Care | Payer: Medicare Other | Source: Home / Self Care | Attending: Nurse Practitioner | Admitting: Nurse Practitioner

## 2020-08-29 ENCOUNTER — Encounter: Payer: Self-pay | Admitting: Nurse Practitioner

## 2020-08-29 ENCOUNTER — Other Ambulatory Visit: Payer: Self-pay

## 2020-08-29 ENCOUNTER — Ambulatory Visit: Payer: Medicare Other | Admitting: Family Medicine

## 2020-08-29 ENCOUNTER — Ambulatory Visit (INDEPENDENT_AMBULATORY_CARE_PROVIDER_SITE_OTHER): Payer: Medicare Other | Admitting: Nurse Practitioner

## 2020-08-29 ENCOUNTER — Ambulatory Visit
Admission: RE | Admit: 2020-08-29 | Discharge: 2020-08-29 | Disposition: A | Discharge status: Home / Self Care | Payer: Medicare Other | Source: Ambulatory Visit | Attending: Nurse Practitioner | Admitting: Nurse Practitioner

## 2020-08-29 VITALS — BP 165/97 | HR 95 | Temp 98.3°F | Wt 282.2 lb

## 2020-08-29 DIAGNOSIS — I10 Essential (primary) hypertension: Secondary | ICD-10-CM | POA: Diagnosis not present

## 2020-08-29 DIAGNOSIS — R062 Wheezing: Secondary | ICD-10-CM | POA: Insufficient documentation

## 2020-08-29 DIAGNOSIS — R7301 Impaired fasting glucose: Secondary | ICD-10-CM

## 2020-08-29 DIAGNOSIS — E7800 Pure hypercholesterolemia, unspecified: Secondary | ICD-10-CM | POA: Diagnosis not present

## 2020-08-29 LAB — BAYER DCA HB A1C WAIVED: HB A1C (BAYER DCA - WAIVED): 6.3 % (ref <7.0)

## 2020-08-29 MED ORDER — METHYLPREDNISOLONE 4 MG PO TBPK: ORAL_TABLET | ORAL | 0 refills | Status: DC

## 2020-08-29 MED ORDER — AMOXICILLIN-POT CLAVULANATE 875-125 MG PO TABS: 1.0000 | ORAL_TABLET | Freq: Two times a day (BID) | ORAL | 0 refills | Status: DC

## 2020-08-29 NOTE — Assessment & Plan Note (Signed)
Rechecking levels today. Await results. Treat as needed.  

## 2020-08-29 NOTE — Progress Notes (Signed)
BP (!) 165/97   Pulse 95   Temp 98.3 F (36.8 C)   Wt 282 lb 4 oz (128 kg)   LMP  (LMP Unknown)   SpO2 94%   BMI 46.97 kg/m    Subjective:    Patient ID: Hannah Edwards, female    DOB: October 27, 1950, 70 y.o.   MRN: [id number]  HPI: Hannah Edwards is a 70 y.o. female  Chief Complaint  Patient presents with  . chest congestion     Productive, light green or yellow  . Follow-up   HYPERLIPIDEMIA Patient denies concerns at visit today.  Does not currently take any medications.   UPPER RESPIRATORY TRACT INFECTION Worst symptom: started Monday.  Chest congestion Fever: no Cough: yes Shortness of breath: no Wheezing: yes Chest pain: yes, with cough Chest tightness: no Chest congestion: yes Nasal congestion: yes Runny nose: no Post nasal drip: yes Sneezing: yes Sore throat: yes Swollen glands: no Sinus pressure: no Headache: no Face pain: no Toothache: no Ear pain: yes bilateral Ear pressure: yes bilateral Eyes red/itching:no Eye drainage/crusting: no  Vomiting: no Rash: no Fatigue: yes Sick contacts: yes Strep contacts: no  Context: worse Recurrent sinusitis: no Relief with OTC cold/cough medications: Niquil  Treatments attempted: niquil   Relevant past medical, surgical, family and social history reviewed and updated as indicated. Interim medical history since our last visit reviewed. Allergies and medications reviewed and updated.  Review of Systems  Constitutional: Negative for fatigue and fever.  HENT: Positive for congestion, ear pain, sneezing and sore throat. Negative for dental problem, postnasal drip, rhinorrhea, sinus pressure and sinus pain.   Eyes: Negative for visual disturbance.  Respiratory: Positive for cough and wheezing. Negative for chest tightness and shortness of breath.   Cardiovascular: Negative for chest pain, palpitations and leg swelling.  Gastrointestinal: Negative for vomiting.  Skin: Negative for rash.  Neurological: Negative for  dizziness and headaches.    Per HPI unless specifically indicated above     Objective:    BP (!) 165/97   Pulse 95   Temp 98.3 F (36.8 C)   Wt 282 lb 4 oz (128 kg)   LMP  (LMP Unknown)   SpO2 94%   BMI 46.97 kg/m   Wt Readings from Last 3 Encounters:  08/29/20 282 lb 4 oz (128 kg)  07/09/20 279 lb (126.6 kg)  02/29/20 285 lb 9.6 oz (129.5 kg)    Physical Exam Vitals and nursing note reviewed.  Constitutional:      General: She is not in acute distress.    Appearance: Normal appearance. She is obese. She is not ill-appearing, toxic-appearing or diaphoretic.  HENT:     Head: Normocephalic.     Right Ear: Tympanic membrane, ear canal and external ear normal.     Left Ear: Tympanic membrane, ear canal and external ear normal.     Nose: Congestion present.     Mouth/Throat:     Mouth: Mucous membranes are moist.     Pharynx: Oropharynx is clear. Posterior oropharyngeal erythema present.  Eyes:     General:        Right eye: No discharge.        Left eye: No discharge.     Extraocular Movements: Extraocular movements intact.     Conjunctiva/sclera: Conjunctivae normal.     Pupils: Pupils are equal, round, and reactive to light.  Cardiovascular:     Rate and Rhythm: Normal rate and regular rhythm.     Heart sounds:  No murmur heard.   Pulmonary:     Effort: Pulmonary effort is normal. No respiratory distress.     Breath sounds: Wheezing present. No rales.  Musculoskeletal:     Cervical back: Normal range of motion and neck supple.  Skin:    General: Skin is warm and dry.     Capillary Refill: Capillary refill takes less than 2 seconds.  Neurological:     General: No focal deficit present.     Mental Status: She is alert and oriented to person, place, and time. Mental status is at baseline.  Psychiatric:        Mood and Affect: Mood normal.        Behavior: Behavior normal.        Thought Content: Thought content normal.        Judgment: Judgment normal.      Results for orders placed or performed in visit on 02/29/20  Bayer DCA Hb A1c Waived  Result Value Ref Range   HB A1C (BAYER DCA - WAIVED) 6.2 <7.0 %  CBC with Differential/Platelet  Result Value Ref Range   WBC 5.9 3.4 - 10.8 x10E3/uL   RBC 4.38 3.77 - 5.28 x10E6/uL   Hemoglobin 13.0 11.1 - 15.9 g/dL   Hematocrit 70.2 63.7 - 46.6 %   MCV 91 79 - 97 fL   MCH 29.7 26.6 - 33.0 pg   MCHC 32.7 31.5 - 35.7 g/dL   RDW 85.8 85.0 - 27.7 %   Platelets 312 150 - 450 x10E3/uL   Neutrophils 50 Not Estab. %   Lymphs 38 Not Estab. %   Monocytes 8 Not Estab. %   Eos 3 Not Estab. %   Basos 1 Not Estab. %   Neutrophils Absolute 3.0 1.4 - 7.0 x10E3/uL   Lymphocytes Absolute 2.2 0.7 - 3.1 x10E3/uL   Monocytes Absolute 0.5 0.1 - 0.9 x10E3/uL   EOS (ABSOLUTE) 0.2 0.0 - 0.4 x10E3/uL   Basophils Absolute 0.1 0.0 - 0.2 x10E3/uL   Immature Granulocytes 0 Not Estab. %   Immature Grans (Abs) 0.0 0.0 - 0.1 x10E3/uL  Comprehensive metabolic panel  Result Value Ref Range   Glucose 92 65 - 99 mg/dL   BUN 11 8 - 27 mg/dL   Creatinine, Ser 4.12 0.57 - 1.00 mg/dL   GFR calc non Af Amer 70 >59 mL/min/1.73   GFR calc Af Amer 80 >59 mL/min/1.73   BUN/Creatinine Ratio 13 12 - 28   Sodium 140 134 - 144 mmol/L   Potassium 4.8 3.5 - 5.2 mmol/L   Chloride 101 96 - 106 mmol/L   CO2 27 20 - 29 mmol/L   Calcium 9.5 8.7 - 10.3 mg/dL   Total Protein 6.9 6.0 - 8.5 g/dL   Albumin 4.1 3.8 - 4.8 g/dL   Globulin, Total 2.8 1.5 - 4.5 g/dL   Albumin/Globulin Ratio 1.5 1.2 - 2.2   Bilirubin Total 0.4 0.0 - 1.2 mg/dL   Alkaline Phosphatase 84 44 - 121 IU/L   AST 14 0 - 40 IU/L   ALT 11 0 - 32 IU/L  Lipid Panel w/o Chol/HDL Ratio  Result Value Ref Range   Cholesterol, Total 199 100 - 199 mg/dL   Triglycerides 73 0 - 149 mg/dL   HDL 49 >87 mg/dL   VLDL Cholesterol Cal 13 5 - 40 mg/dL   LDL Chol Calc (NIH) 867 (H) 0 - 99 mg/dL  Microalbumin, Urine Waived  Result Value Ref Range   Microalb, Ur Waived  30 (H) 0 -  19 mg/L   Creatinine, Urine Waived 200 10 - 300 mg/dL   Microalb/Creat Ratio <30 <30 mg/g      Assessment & Plan:   Problem List Items Addressed This Visit      Cardiovascular and Mediastinum   Essential hypertension - Primary    Patient has been stressed at work and is not feeling well.  Will recheck in one month and make medication suggetions if needed.  Letter written for patient.      Relevant Orders   Lipid panel   Basic metabolic panel     Endocrine   IFG (impaired fasting glucose)    Doing well with A1c of 6.2. Continue to monitor. Call with any concerns.       Relevant Orders   Bayer DCA Hb A1c Waived     Other   Hyperlipidemia    Rechecking levels today. Await results. Treat as needed.       Relevant Orders   Lipid panel    Other Visit Diagnoses    Wheezing       Chest xray ordered today.  Will make recommendations based on lab results.  Predinisone sent to the pharmacy today. Complete course of antibiotics.   Relevant Orders   DG Chest 2 View       Follow up plan: Return in about 1 month (around 09/28/2020) for BP Check.

## 2020-08-29 NOTE — Assessment & Plan Note (Signed)
Doing well with A1c of 6.2. Continue to monitor. Call with any concerns.

## 2020-08-29 NOTE — Assessment & Plan Note (Signed)
Patient has been stressed at work and is not feeling well.  Will recheck in one month and make medication suggetions if needed.  Letter written for patient.

## 2020-08-30 LAB — BASIC METABOLIC PANEL
BUN/Creatinine Ratio: 11 — ABNORMAL LOW (ref 12–28)
BUN: 9 mg/dL (ref 8–27)
CO2: 23 mmol/L (ref 20–29)
Calcium: 9.3 mg/dL (ref 8.7–10.3)
Chloride: 100 mmol/L (ref 96–106)
Creatinine, Ser: 0.83 mg/dL (ref 0.57–1.00)
Glucose: 91 mg/dL (ref 65–99)
Potassium: 4 mmol/L (ref 3.5–5.2)
Sodium: 142 mmol/L (ref 134–144)
eGFR: 75 mL/min/{1.73_m2} (ref >59)

## 2020-08-30 LAB — LIPID PANEL
Chol/HDL Ratio: 4.8 ratio — ABNORMAL HIGH (ref 0.0–4.4)
Cholesterol, Total: 191 mg/dL (ref 100–199)
HDL: 40 mg/dL (ref >39)
LDL Chol Calc (NIH): 136 mg/dL — ABNORMAL HIGH (ref 0–99)
Triglycerides: 79 mg/dL (ref 0–149)
VLDL Cholesterol Cal: 15 mg/dL (ref 5–40)

## 2020-08-30 NOTE — Progress Notes (Signed)
Please let the patient know that her chest xray was normal.  Continue with recommendations discussed in the visit.

## 2020-08-30 NOTE — Progress Notes (Signed)
Please let the patient know that her Cholesterol is consistent with prior. Her LDL (bad cholesterol) is still elevated.  Recommend she follow a low fat diet.  A1c remains well controlled at 6.3.  Liver, Kidneys, and electrolytes look good.  Please let me know if she has any questions.  Follow up as discussed.

## 2020-09-07 ENCOUNTER — Telehealth: Payer: Self-pay

## 2020-09-07 NOTE — Telephone Encounter (Signed)
Patient would like to return next Friday.

## 2020-09-07 NOTE — Telephone Encounter (Signed)
Letter faxed to number provided by patient.

## 2020-09-07 NOTE — Telephone Encounter (Signed)
Today is 5/6.  The original letter states she would return to work today. What day is she planning to return to work?

## 2020-09-07 NOTE — Telephone Encounter (Signed)
Letter written

## 2020-09-07 NOTE — Telephone Encounter (Signed)
Routing to provider  

## 2020-09-07 NOTE — Telephone Encounter (Signed)
Copied from CRM [id number]. Topic: General - Other >> Sep 07, 2020  8:39 AM Tamela Oddi wrote: Reason for CRM: Patient is requesting a note for her employer to extend her stay out of work because she is still not feeling well.  Please advise and call patient to let her know how she can get the note.  CB# [phone]   Would pt need another apt for this letter stated she would go back to work on 09/07/2020 please advise.

## 2020-09-28 ENCOUNTER — Ambulatory Visit: Payer: Medicare Other | Admitting: Nurse Practitioner

## 2020-10-10 ENCOUNTER — Encounter: Payer: Self-pay | Admitting: Nurse Practitioner

## 2020-10-10 ENCOUNTER — Ambulatory Visit (INDEPENDENT_AMBULATORY_CARE_PROVIDER_SITE_OTHER): Payer: Medicare Other | Admitting: Nurse Practitioner

## 2020-10-10 ENCOUNTER — Other Ambulatory Visit: Payer: Self-pay

## 2020-10-10 DIAGNOSIS — I10 Essential (primary) hypertension: Secondary | ICD-10-CM

## 2020-10-10 NOTE — Assessment & Plan Note (Signed)
Chronic.  Stable.  Elevated in office.  Patient just came from work. Controlled when she checks it regularly at home.  Will continue without medications at this time.  If blood pressure increases to 130/90s return to clinic for medication adjustment. DASH diet and exercise recommended to aid in blood pressure control.

## 2020-10-10 NOTE — Progress Notes (Signed)
BP 138/88   Pulse 88   Temp 98 F (36.7 C)   Ht 5' 4.29" (1.633 m)   Wt 282 lb (127.9 kg)   LMP  (LMP Unknown)   SpO2 96%   BMI 47.97 kg/m    Subjective:    Patient ID: Hannah Edwards, female    DOB: Dec 27, 1950, 70 y.o.   MRN: [id number]  HPI: Hannah Edwards is a 70 y.o. female  Chief Complaint  Patient presents with  . Hypertension   HYPERTENSION Hypertension status: controlled  Satisfied with current treatment? no Duration of hypertension: years BP monitoring frequency:  weekly BP range: 115-120/80 BP medication side effects:  no Medication compliance: excellent compliance Previous BP meds:none Aspirin: no Recurrent headaches: no Visual changes: no Palpitations: no Dyspnea: no Chest pain: no Lower extremity edema: no Dizzy/lightheaded: no   Relevant past medical, surgical, family and social history reviewed and updated as indicated. Interim medical history since our last visit reviewed. Allergies and medications reviewed and updated.  Review of Systems  Eyes: Negative for visual disturbance.  Respiratory: Negative for cough, chest tightness and shortness of breath.   Cardiovascular: Negative for chest pain, palpitations and leg swelling.  Neurological: Negative for dizziness and headaches.    Per HPI unless specifically indicated above     Objective:    BP 138/88   Pulse 88   Temp 98 F (36.7 C)   Ht 5' 4.29" (1.633 m)   Wt 282 lb (127.9 kg)   LMP  (LMP Unknown)   SpO2 96%   BMI 47.97 kg/m   Wt Readings from Last 3 Encounters:  10/10/20 282 lb (127.9 kg)  08/29/20 282 lb 4 oz (128 kg)  07/09/20 279 lb (126.6 kg)    Physical Exam Vitals and nursing note reviewed.  Constitutional:      General: She is not in acute distress.    Appearance: Normal appearance. She is obese. She is not ill-appearing, toxic-appearing or diaphoretic.  HENT:     Head: Normocephalic.     Right Ear: External ear normal.     Left Ear: External ear normal.     Nose:  Nose normal.     Mouth/Throat:     Mouth: Mucous membranes are moist.     Pharynx: Oropharynx is clear.  Eyes:     General:        Right eye: No discharge.        Left eye: No discharge.     Extraocular Movements: Extraocular movements intact.     Conjunctiva/sclera: Conjunctivae normal.     Pupils: Pupils are equal, round, and reactive to light.  Cardiovascular:     Rate and Rhythm: Normal rate and regular rhythm.     Heart sounds: No murmur heard.   Pulmonary:     Effort: Pulmonary effort is normal. No respiratory distress.     Breath sounds: Normal breath sounds. No wheezing or rales.  Musculoskeletal:     Cervical back: Normal range of motion and neck supple.  Skin:    General: Skin is warm and dry.     Capillary Refill: Capillary refill takes less than 2 seconds.  Neurological:     General: No focal deficit present.     Mental Status: She is alert and oriented to person, place, and time. Mental status is at baseline.  Psychiatric:        Mood and Affect: Mood normal.        Behavior: Behavior normal.  Thought Content: Thought content normal.        Judgment: Judgment normal.     Results for orders placed or performed in visit on 08/29/20  Bayer DCA Hb A1c Waived  Result Value Ref Range   HB A1C (BAYER DCA - WAIVED) 6.3 <7.0 %  Lipid panel  Result Value Ref Range   Cholesterol, Total 191 100 - 199 mg/dL   Triglycerides 79 0 - 149 mg/dL   HDL 40 >39 mg/dL   VLDL Cholesterol Cal 15 5 - 40 mg/dL   LDL Chol Calc (NIH) 136 (H) 0 - 99 mg/dL   Chol/HDL Ratio 4.8 (H) 0.0 - 4.4 ratio  Basic metabolic panel  Result Value Ref Range   Glucose 91 65 - 99 mg/dL   BUN 9 8 - 27 mg/dL   Creatinine, Ser 0.83 0.57 - 1.00 mg/dL   eGFR 75 >59 mL/min/1.73   BUN/Creatinine Ratio 11 (L) 12 - 28   Sodium 142 134 - 144 mmol/L   Potassium 4.0 3.5 - 5.2 mmol/L   Chloride 100 96 - 106 mmol/L   CO2 23 20 - 29 mmol/L   Calcium 9.3 8.7 - 10.3 mg/dL      Assessment & Plan:    Problem List Items Addressed This Visit      Cardiovascular and Mediastinum   Essential hypertension    Chronic.  Stable.  Elevated in office.  Patient just came from work. Controlled when she checks it regularly at home.  Will continue without medications at this time.  If blood pressure increases to 130/90s return to clinic for medication adjustment. DASH diet and exercise recommended to aid in blood pressure control.        Other   Morbid obesity ([place]) - Primary    Recommend diet and exercise to aid in healthy lifestyle.           Follow up plan: Return in about 5 months (around 03/12/2021) for HTN, HLD, DM2 FU.   A total of 20 minutes were spent on this encounter today.  When total time is documented, this includes both the face-to-face and non-face-to-face time personally spent before, during and after the visit on the date of the encounter.

## 2020-10-10 NOTE — Assessment & Plan Note (Signed)
Recommend diet and exercise to aid in healthy lifestyle.

## 2021-02-13 ENCOUNTER — Other Ambulatory Visit: Payer: Self-pay

## 2021-02-13 ENCOUNTER — Ambulatory Visit (INDEPENDENT_AMBULATORY_CARE_PROVIDER_SITE_OTHER): Payer: Medicare Other

## 2021-02-13 DIAGNOSIS — Z23 Encounter for immunization: Secondary | ICD-10-CM

## 2021-02-13 NOTE — Progress Notes (Signed)
Patient presents today for Flu vaccination, patient received in Left   deltoid, patient tolerated well.   

## 2021-02-15 DIAGNOSIS — U071 COVID-19: Secondary | ICD-10-CM | POA: Diagnosis not present

## 2021-02-15 DIAGNOSIS — Z23 Encounter for immunization: Secondary | ICD-10-CM | POA: Diagnosis not present

## 2021-03-12 NOTE — Progress Notes (Signed)
BP 123/81   Pulse 85   Temp 97.9 F (36.6 C)   Ht 5' 4"  (1.626 m)   Wt 278 lb (126.1 kg)   LMP  (LMP Unknown)   SpO2 96%   BMI 47.72 kg/m    Subjective:    Patient ID: Hannah Edwards, female    DOB: 19-Feb-1951, 70 y.o.   MRN: [id number]  HPI: Hannah Edwards is a 70 y.o. female  Chief Complaint  Patient presents with   Hypertension   Hyperlipidemia   Diabetes   HYPERTENSION / [place] Satisfied with current treatment? yes Duration of hypertension: years BP monitoring frequency: daily BP range: 112-120/60 BP medication side effects: no Past BP meds: none Duration of hyperlipidemia: years Cholesterol medication side effects: no Cholesterol supplements: none Past cholesterol medications: none Medication compliance:  no medication Aspirin: no Recent stressors: no Recurrent headaches: no Visual changes: no Palpitations: no Dyspnea: no Chest pain: no Lower extremity edema: no Dizzy/lightheaded: no    Relevant past medical, surgical, family and social history reviewed and updated as indicated. Interim medical history since our last visit reviewed. Allergies and medications reviewed and updated.  Review of Systems  Eyes:  Negative for visual disturbance.  Respiratory:  Negative for cough, chest tightness and shortness of breath.   Cardiovascular:  Negative for chest pain, palpitations and leg swelling.  Neurological:  Negative for dizziness and headaches.   Per HPI unless specifically indicated above     Objective:    BP 123/81   Pulse 85   Temp 97.9 F (36.6 C)   Ht 5' 4"  (1.626 m)   Wt 278 lb (126.1 kg)   LMP  (LMP Unknown)   SpO2 96%   BMI 47.72 kg/m   Wt Readings from Last 3 Encounters:  03/13/21 278 lb (126.1 kg)  10/10/20 282 lb (127.9 kg)  08/29/20 282 lb 4 oz (128 kg)    Physical Exam Vitals and nursing note reviewed.  Constitutional:      General: She is not in acute distress.    Appearance: Normal appearance. She is obese. She is not  ill-appearing, toxic-appearing or diaphoretic.  HENT:     Head: Normocephalic.     Right Ear: External ear normal.     Left Ear: External ear normal.     Nose: Nose normal.     Mouth/Throat:     Mouth: Mucous membranes are moist.     Pharynx: Oropharynx is clear.  Eyes:     General:        Right eye: No discharge.        Left eye: No discharge.     Extraocular Movements: Extraocular movements intact.     Conjunctiva/sclera: Conjunctivae normal.     Pupils: Pupils are equal, round, and reactive to light.  Cardiovascular:     Rate and Rhythm: Normal rate and regular rhythm.     Heart sounds: No murmur heard. Pulmonary:     Effort: Pulmonary effort is normal. No respiratory distress.     Breath sounds: Normal breath sounds. No wheezing or rales.  Musculoskeletal:     Cervical back: Normal range of motion and neck supple.  Skin:    General: Skin is warm and dry.     Capillary Refill: Capillary refill takes less than 2 seconds.  Neurological:     General: No focal deficit present.     Mental Status: She is alert and oriented to person, place, and time. Mental status is at baseline.  Psychiatric:        Mood and Affect: Mood normal.        Behavior: Behavior normal.        Thought Content: Thought content normal.        Judgment: Judgment normal.    Results for orders placed or performed in visit on 08/29/20  Bayer DCA Hb A1c Waived  Result Value Ref Range   HB A1C (BAYER DCA - WAIVED) 6.3 <7.0 %  Lipid panel  Result Value Ref Range   Cholesterol, Total 191 100 - 199 mg/dL   Triglycerides 79 0 - 149 mg/dL   HDL 40 >39 mg/dL   VLDL Cholesterol Cal 15 5 - 40 mg/dL   LDL Chol Calc (NIH) 136 (H) 0 - 99 mg/dL   Chol/HDL Ratio 4.8 (H) 0.0 - 4.4 ratio  Basic metabolic panel  Result Value Ref Range   Glucose 91 65 - 99 mg/dL   BUN 9 8 - 27 mg/dL   Creatinine, Ser 0.83 0.57 - 1.00 mg/dL   eGFR 75 >59 mL/min/1.73   BUN/Creatinine Ratio 11 (L) 12 - 28   Sodium 142 134 - 144  mmol/L   Potassium 4.0 3.5 - 5.2 mmol/L   Chloride 100 96 - 106 mmol/L   CO2 23 20 - 29 mmol/L   Calcium 9.3 8.7 - 10.3 mg/dL      Assessment & Plan:   Problem List Items Addressed This Visit       Cardiovascular and Mediastinum   Essential hypertension    Chronic.  Controlled without medication. Labs ordered today.  Return to clinic in 6 months for reevaluation.  Call sooner if concerns arise.        Relevant Orders   Comp Met (CMET)     Endocrine   IFG (impaired fasting glucose)    Labs ordered today.  Will make recommendations based on lab results.       Relevant Orders   HgB A1c     Other   Hyperlipidemia    Chronic.  Controlled without medication.  Labs ordered today.  Return to clinic in 6 months for reevaluation.  Call sooner if concerns arise.        Relevant Orders   Lipid Profile   BMI 45.0-49.9, adult ([place]) - Primary    Recommend a healthy lifestyle through diet and exercise.         Follow up plan: Return in about 6 months (around 09/10/2021) for HTN, HLD, DM2 FU.   A total of 20 minutes were spent on this encounter today.  When total time is documented, this includes both the face-to-face and non-face-to-face time personally spent before, during and after the visit on the date of the encounter.

## 2021-03-13 ENCOUNTER — Ambulatory Visit (INDEPENDENT_AMBULATORY_CARE_PROVIDER_SITE_OTHER): Payer: Medicare Other | Admitting: Nurse Practitioner

## 2021-03-13 ENCOUNTER — Encounter: Payer: Self-pay | Admitting: Nurse Practitioner

## 2021-03-13 ENCOUNTER — Other Ambulatory Visit: Payer: Self-pay

## 2021-03-13 VITALS — BP 123/81 | HR 85 | Temp 97.9°F | Ht 64.0 in | Wt 278.0 lb

## 2021-03-13 DIAGNOSIS — I10 Essential (primary) hypertension: Secondary | ICD-10-CM

## 2021-03-13 DIAGNOSIS — Z6841 Body Mass Index (BMI) 40.0 and over, adult: Secondary | ICD-10-CM | POA: Diagnosis not present

## 2021-03-13 DIAGNOSIS — R7301 Impaired fasting glucose: Secondary | ICD-10-CM | POA: Diagnosis not present

## 2021-03-13 DIAGNOSIS — E7800 Pure hypercholesterolemia, unspecified: Secondary | ICD-10-CM

## 2021-03-13 MED ORDER — OMEPRAZOLE 20 MG PO CPDR: 20.0000 mg | DELAYED_RELEASE_CAPSULE | Freq: Every day | ORAL | 3 refills | Status: DC

## 2021-03-13 NOTE — Assessment & Plan Note (Signed)
Labs ordered today.  Will make recommendations based on lab results. ?

## 2021-03-13 NOTE — Assessment & Plan Note (Signed)
Chronic.  Controlled without medication..  Labs ordered today.  Return to clinic in 6 months for reevaluation.  Call sooner if concerns arise.  ° °

## 2021-03-13 NOTE — Assessment & Plan Note (Signed)
Recommend a healthy lifestyle through diet and exercise.  °

## 2021-03-14 LAB — LIPID PANEL
Chol/HDL Ratio: 4.2 ratio (ref 0.0–4.4)
Cholesterol, Total: 201 mg/dL — ABNORMAL HIGH (ref 100–199)
HDL: 48 mg/dL (ref >39)
LDL Chol Calc (NIH): 138 mg/dL — ABNORMAL HIGH (ref 0–99)
Triglycerides: 83 mg/dL (ref 0–149)
VLDL Cholesterol Cal: 15 mg/dL (ref 5–40)

## 2021-03-14 LAB — COMPREHENSIVE METABOLIC PANEL
ALT: 10 IU/L (ref 0–32)
AST: 12 IU/L (ref 0–40)
Albumin/Globulin Ratio: 1.5 (ref 1.2–2.2)
Albumin: 4.4 g/dL (ref 3.7–4.7)
Alkaline Phosphatase: 88 IU/L (ref 44–121)
BUN/Creatinine Ratio: 15 (ref 12–28)
BUN: 13 mg/dL (ref 8–27)
Bilirubin Total: 0.4 mg/dL (ref 0.0–1.2)
CO2: 23 mmol/L (ref 20–29)
Calcium: 9.5 mg/dL (ref 8.7–10.3)
Chloride: 105 mmol/L (ref 96–106)
Creatinine, Ser: 0.85 mg/dL (ref 0.57–1.00)
Globulin, Total: 2.9 g/dL (ref 1.5–4.5)
Glucose: 102 mg/dL — ABNORMAL HIGH (ref 70–99)
Potassium: 4.7 mmol/L (ref 3.5–5.2)
Sodium: 144 mmol/L (ref 134–144)
Total Protein: 7.3 g/dL (ref 6.0–8.5)
eGFR: 73 mL/min/{1.73_m2} (ref >59)

## 2021-03-14 LAB — HEMOGLOBIN A1C
Est. average glucose Bld gHb Est-mCnc: 131 mg/dL
Hgb A1c MFr Bld: 6.2 % — ABNORMAL HIGH (ref 4.8–5.6)

## 2021-03-14 NOTE — Progress Notes (Signed)
Please let patient know that her lab work looks good.  A1c remains well controlled at 6.2.  Cholesterol is slightly elevated.  Recommend a low fat diet and exercise.  Follow up as discussed.

## 2021-07-05 ENCOUNTER — Encounter: Payer: Self-pay | Admitting: Nurse Practitioner

## 2021-07-08 ENCOUNTER — Telehealth: Payer: Self-pay | Admitting: Nurse Practitioner

## 2021-07-08 NOTE — Telephone Encounter (Signed)
Pt is calling to reschedule AWV to a Tuesday or Wednesday. ?CB- [phone] ?

## 2021-07-11 ENCOUNTER — Ambulatory Visit (INDEPENDENT_AMBULATORY_CARE_PROVIDER_SITE_OTHER): Payer: Medicare Other | Admitting: *Deleted

## 2021-07-11 DIAGNOSIS — Z1231 Encounter for screening mammogram for malignant neoplasm of breast: Secondary | ICD-10-CM | POA: Diagnosis not present

## 2021-07-11 DIAGNOSIS — Z0000 Encounter for general adult medical examination without abnormal findings: Secondary | ICD-10-CM

## 2021-07-11 DIAGNOSIS — Z780 Asymptomatic menopausal state: Secondary | ICD-10-CM

## 2021-07-11 NOTE — Progress Notes (Signed)
Subjective:   Hannah Edwards is a 71 y.o. female who presents for Medicare Annual (Subsequent) preventive examination.  I connected with  Bianca Raneri on 07/11/21 by a telephone enabled telemedicine application and verified that I am speaking with the correct person using two identifiers.   I discussed the limitations of evaluation and management by telemedicine. The patient expressed understanding and agreed to proceed.  Patient location: home  Provider location: Telephone not in office   Review of Systems     Cardiac Risk Factors include: advanced age (>90men, >52 women);obesity (BMI >30kg/m2)     Objective:    Today's Vitals   There is no height or weight on file to calculate BMI.  Advanced Directives 07/11/2021 07/09/2020 07/04/2019 06/30/2019 06/29/2019 01/13/2018 12/10/2016  Does Patient Have a Medical Advance Directive? No Yes No No No No No  Type of Advance Directive - Living will - - - - -  Would patient like information on creating a medical advance directive? No - Patient declined - - No - Patient declined - Yes (MAU/Ambulatory/Procedural Areas - Information given) Yes (ED - Information included in AVS)    Current Medications (verified) Outpatient Encounter Medications as of 07/11/2021  Medication Sig   acetaminophen (TYLENOL) 500 MG tablet Take 1,000 mg by mouth every 6 (six) hours as needed.   omeprazole (PRILOSEC) 20 MG capsule Take 1 capsule (20 mg total) by mouth daily.   No facility-administered encounter medications on file as of 07/11/2021.    Allergies (verified) Ace inhibitors, Lisinopril, and Atorvastatin   History: History reviewed. No pertinent past medical history. Past Surgical History:  Procedure Laterality Date   ABDOMINAL HYSTERECTOMY     WRIST SURGERY Left    Family History  Problem Relation Age of Onset   Hypertension Mother    Cancer Mother        unknown type    Hypertension Father    Aneurysm Father    Stomach cancer Sister    Diabetes  Brother    HIV/AIDS Brother    Breast cancer Neg Hx    Lung cancer Neg Hx    Social History   Socioeconomic History   Marital status: Widowed    Spouse name: Not on file   Number of children: Not on file   Years of education: Not on file   Highest education level: Bachelor's degree (e.g., BA, AB, BS)  Occupational History   Not on file  Tobacco Use   Smoking status: Former    Types: Cigarettes    Quit date: 01/05/1975    Years since quitting: 46.5   Smokeless tobacco: Never  Vaping Use   Vaping Use: Never used  Substance and Sexual Activity   Alcohol use: No    Alcohol/week: 0.0 standard drinks   Drug use: No   Sexual activity: Never  Other Topics Concern   Not on file  Social History Narrative   Working full time    Social Determinants of Corporate investment banker Strain: Low Risk    Difficulty of Paying Living Expenses: Not hard at all  Food Insecurity: No Food Insecurity   Worried About Programme researcher, broadcasting/film/video in the Last Year: Never true   Barista in the Last Year: Never true  Transportation Needs: No Transportation Needs   Lack of Transportation (Medical): No   Lack of Transportation (Non-Medical): No  Physical Activity: Insufficiently Active   Days of Exercise per Week: 3 days   Minutes of  Exercise per Session: 40 min  Stress: No Stress Concern Present   Feeling of Stress : Not at all  Social Connections: Moderately Integrated   Frequency of Communication with Friends and Family: More than three times a week   Frequency of Social Gatherings with Friends and Family: Three times a week   Attends Religious Services: More than 4 times per year   Active Member of Clubs or Organizations: Yes   Attends Banker Meetings: More than 4 times per year   Marital Status: Widowed    Tobacco Counseling Counseling given: Not Answered   Clinical Intake:  Pre-visit preparation completed: Yes  Pain : No/denies pain     Nutritional Risks:  None Diabetes: No  How often do you need to have someone help you when you read instructions, pamphlets, or other written materials from your doctor or pharmacy?: 1 - Never  Diabetic?  no  Interpreter Needed?: No  Information entered by :: Remi Haggard LPN   Activities of Daily Living In your present state of health, do you have any difficulty performing the following activities: 07/11/2021  Hearing? N  Vision? N  Difficulty concentrating or making decisions? N  Walking or climbing stairs? N  Dressing or bathing? N  Doing errands, shopping? N  Preparing Food and eating ? N  Using the Toilet? N  In the past six months, have you accidently leaked urine? N  Do you have problems with loss of bowel control? N  Managing your Medications? N  Managing your Finances? N  Housekeeping or managing your Housekeeping? N  Some recent data might be hidden    Patient Care Team: Larae Grooms, NP as PCP - General  Indicate any recent Medical Services you may have received from other than [facility] providers in the past year (date may be approximate).     Assessment:   This is a routine wellness examination for Hannah Edwards.  Hearing/Vision screen Hearing Screening - Comments:: Not trouble hearing Vision Screening - Comments:: My eye doctor  Dietary issues and exercise activities discussed: Current Exercise Habits: Home exercise routine;The patient has a physically strenuous job, but has no regular exercise apart from work., Type of exercise: walking, Time (Minutes): 45, Frequency (Times/Week): 4, Weekly Exercise (Minutes/Week): 180, Intensity: Moderate   Goals Addressed             This Visit's Progress    Patient Stated       Would like to not be working       Depression Screen PHQ 2/9 Scores 07/11/2021 07/11/2021 03/13/2021 07/09/2020 07/04/2019 01/13/2018 12/10/2016  PHQ - 2 Score 0 0 0 0 0 0 0  PHQ- 9 Score - - - - - - 0    Fall Risk Fall Risk  07/11/2021 03/13/2021 07/09/2020 07/04/2019  11/16/2018  Falls in the past year? 0 0 0 0 0  Number falls in past yr: 0 0 - 0 0  Injury with Fall? 0 0 - 0 0  Risk for fall due to : - No Fall Risks No Fall Risks - -  Follow up Falls evaluation completed;Falls prevention discussed;Education provided Falls evaluation completed Falls evaluation completed;Education provided;Falls prevention discussed - -    FALL RISK PREVENTION PERTAINING TO THE HOME:  Any stairs in or around the home? Yes  If so, are there any without handrails? No  Home free of loose throw rugs in walkways, pet beds, electrical cords, etc? Yes  Adequate lighting in your home to reduce risk  of falls? Yes   ASSISTIVE DEVICES UTILIZED TO PREVENT FALLS:  Life alert? No  Use of a cane, walker or w/c? No  Grab bars in the bathroom? No  Shower chair or bench in shower? No  Elevated toilet seat or a handicapped toilet? Yes   TIMED UP AND GO:  Was the test performed? No .    Cognitive Function:  Normal cognitive status assessed by direct observation by this Nurse Health Advisor. No abnormalities found.       6CIT Screen 07/09/2020 01/13/2018  What Year? 0 points 0 points  What month? 0 points 0 points  What time? 0 points 0 points  Count back from 20 0 points 0 points  Months in reverse 0 points 0 points  Repeat phrase 0 points 0 points  Total Score 0 0    Immunizations Immunization History  Administered Date(s) Administered   Fluad Quad(high Dose 65+) 02/08/2019, 02/29/2020, 02/13/2021   Influenza, High Dose Seasonal PF 01/13/2018   Influenza-Unspecified 02/12/2015   PFIZER(Purple Top)SARS-COV-2 Vaccination 09/28/2019, 10/19/2019, 04/11/2020   PPD Test 09/09/2013   Pneumococcal Conjugate-13 12/10/2016   Pneumococcal Polysaccharide-23 01/13/2018   Zoster, Live 06/17/2010    TDAP status: Due, Education has been provided regarding the importance of this vaccine. Advised may receive this vaccine at local pharmacy or Health Dept. Aware to provide a copy of  the vaccination record if obtained from local pharmacy or Health Dept. Verbalized acceptance and understanding.  Flu Vaccine status: Up to date  Pneumococcal vaccine status: Up to date  Covid-19 vaccine status: Information provided on how to obtain vaccines.   Qualifies for Shingles Vaccine? Yes   Zostavax completed Yes   Shingrix Completed?: No.    Education has been provided regarding the importance of this vaccine. Patient has been advised to call insurance company to determine out of pocket expense if they have not yet received this vaccine. Advised may also receive vaccine at local pharmacy or Health Dept. Verbalized acceptance and understanding.  Screening Tests Health Maintenance  Topic Date Due   Fecal DNA (Cologuard)  02/28/2020   COVID-19 Vaccine (4 - Booster for Pfizer series) 06/06/2020   Zoster Vaccines- Shingrix (1 of 2) 10/11/2021 (Originally 05/23/1999)   TETANUS/TDAP  07/12/2022 (Originally [birth year]/1968)   MAMMOGRAM  04/03/2022   Pneumonia Vaccine 5765+ Years old  Completed   INFLUENZA VACCINE  Completed   DEXA SCAN  Completed   Hepatitis C Screening  Completed   HPV VACCINES  Aged Out   COLONOSCOPY (Pts 45-4831yrs Insurance coverage will need to be confirmed)  Discontinued    Health Maintenance  Health Maintenance Due  Topic Date Due   Fecal DNA (Cologuard)  02/28/2020   COVID-19 Vaccine (4 - Booster for Pfizer series) 06/06/2020    Colorectal cancer screening: Referral to GI placed Cologuard. Pt aware the office will call re: appt.  Mammogram status: Ordered  . Pt provided with contact info and advised to call to schedule appt.   Bone Density status: Ordered  . Pt provided with contact info and advised to call to schedule appt.  Lung Cancer Screening: (Low Dose CT Chest recommended if Age 60-80 years, 30 pack-year currently smoking OR have quit w/in 15years.) does not qualify.   Lung Cancer Screening Referral:   Additional Screening:  Hepatitis C Screening:  does not qualify; Completed 2017  Vision Screening: Recommended annual ophthalmology exams for early detection of glaucoma and other disorders of the eye. Is the patient up to date with their  annual eye exam?  No  Who is the provider or what is the name of the office in which the patient attends annual eye exams? [facility] If pt is not established with a provider, would they like to be referred to a provider to establish care? No .   Dental Screening: Recommended annual dental exams for proper oral hygiene  Community Resource Referral / Chronic Care Management: CRR required this visit?  No   CCM required this visit?  No      Plan:     I have personally reviewed and noted the following in the patients chart:   Medical and social history Use of alcohol, tobacco or illicit drugs  Current medications and supplements including opioid prescriptions.  Functional ability and status Nutritional status Physical activity Advanced directives List of other physicians Hospitalizations, surgeries, and ER visits in previous 12 months Vitals Screenings to include cognitive, depression, and falls Referrals and appointments  In addition, I have reviewed and discussed with patient certain preventive protocols, quality metrics, and best practice recommendations. A written personalized care plan for preventive services as well as general preventive health recommendations were provided to patient.     Remi Haggard, LPN   5/0/0938   Nurse Notes:

## 2021-07-11 NOTE — Patient Instructions (Addendum)
Hannah Edwards , Thank you for taking time to come for your Medicare Wellness Visit. I appreciate your ongoing commitment to your health goals. Please review the following plan we discussed and let me know if I can assist you in the future.   Screening recommendations/referrals: Colonoscopy: Cologuard ordered Mammogram: Education provided Bone Density: Education provided Recommended yearly ophthalmology/optometry visit for glaucoma screening and checkup Recommended yearly dental visit for hygiene and checkup  Vaccinations: Influenza vaccine: up to  date Pneumococcal vaccine: up to date Tdap vaccine: up to date Shingles vaccine: Education provided    Advanced directives: Education provided  Conditions/risks identified:   Next appointment: 09-11-2021 @ 11:00 [facility] 65 Years and Older, Female Preventive care refers to lifestyle choices and visits with your health care provider that can promote health and wellness. What does preventive care include? A yearly physical exam. This is also called an annual well check. Dental exams once or twice a year. Routine eye exams. Ask your health care provider how often you should have your eyes checked. Personal lifestyle choices, including: Daily care of your teeth and gums. Regular physical activity. Eating a healthy diet. Avoiding tobacco and drug use. Limiting alcohol use. Practicing safe sex. Taking low-dose aspirin every day. Taking vitamin and mineral supplements as recommended by your health care provider. What happens during an annual well check? The services and screenings done by your health care provider during your annual well check will depend on your age, overall health, lifestyle risk factors, and family history of disease. Counseling  Your health care provider may ask you questions about your: Alcohol use. Tobacco use. Drug use. Emotional well-being. Home and relationship well-being. Sexual  activity. Eating habits. History of falls. Memory and ability to understand (cognition). Work and work Astronomer. Reproductive health. Screening  You may have the following tests or measurements: Height, weight, and BMI. Blood pressure. Lipid and cholesterol levels. These may be checked every 5 years, or more frequently if you are over 25 years old. Skin check. Lung cancer screening. You may have this screening every year starting at age 61 if you have a 30-pack-year history of smoking and currently smoke or have quit within the past 15 years. Fecal occult blood test (FOBT) of the stool. You may have this test every year starting at age 12. Flexible sigmoidoscopy or colonoscopy. You may have a sigmoidoscopy every 5 years or a colonoscopy every 10 years starting at age 44. Hepatitis C blood test. Hepatitis B blood test. Sexually transmitted disease (STD) testing. Diabetes screening. This is done by checking your blood sugar (glucose) after you have not eaten for a while (fasting). You may have this done every 1-3 years. Bone density scan. This is done to screen for osteoporosis. You may have this done starting at age 15. Mammogram. This may be done every 1-2 years. Talk to your health care provider about how often you should have regular mammograms. Talk with your health care provider about your test results, treatment options, and if necessary, the need for more tests. Vaccines  Your health care provider may recommend certain vaccines, such as: Influenza vaccine. This is recommended every year. Tetanus, diphtheria, and acellular pertussis (Tdap, Td) vaccine. You may need a Td booster every 10 years. Zoster vaccine. You may need this after age 62. Pneumococcal 13-valent conjugate (PCV13) vaccine. One dose is recommended after age 51. Pneumococcal polysaccharide (PPSV23) vaccine. One dose is recommended after age 24. Talk to your health care provider about which  screenings and vaccines  you need and how often you need them. This information is not intended to replace advice given to you by your health care provider. Make sure you discuss any questions you have with your health care provider. Document Released: 05/18/2015 Document Revised: 01/09/2016 Document Reviewed: 02/20/2015 Elsevier Interactive Patient Education  2017 [place] Prevention in the Home Falls can cause injuries. They can happen to people of all ages. There are many things you can do to make your home safe and to help prevent falls. What can I do on the outside of my home? Regularly fix the edges of walkways and driveways and fix any cracks. Remove anything that might make you trip as you walk through a door, such as a raised step or threshold. Trim any bushes or trees on the path to your home. Use bright outdoor lighting. Clear any walking paths of anything that might make someone trip, such as rocks or tools. Regularly check to see if handrails are loose or broken. Make sure that both sides of any steps have handrails. Any raised decks and porches should have guardrails on the edges. Have any leaves, snow, or ice cleared regularly. Use sand or salt on walking paths during winter. Clean up any spills in your garage right away. This includes oil or grease spills. What can I do in the bathroom? Use night lights. Install grab bars by the toilet and in the tub and shower. Do not use towel bars as grab bars. Use non-skid mats or decals in the tub or shower. If you need to sit down in the shower, use a plastic, non-slip stool. Keep the floor dry. Clean up any water that spills on the floor as soon as it happens. Remove soap buildup in the tub or shower regularly. Attach bath mats securely with double-sided non-slip rug tape. Do not have throw rugs and other things on the floor that can make you trip. What can I do in the bedroom? Use night lights. Make sure that you have a light by your bed that  is easy to reach. Do not use any sheets or blankets that are too big for your bed. They should not hang down onto the floor. Have a firm chair that has side arms. You can use this for support while you get dressed. Do not have throw rugs and other things on the floor that can make you trip. What can I do in the kitchen? Clean up any spills right away. Avoid walking on wet floors. Keep items that you use a lot in easy-to-reach places. If you need to reach something above you, use a strong step stool that has a grab bar. Keep electrical cords out of the way. Do not use floor polish or wax that makes floors slippery. If you must use wax, use non-skid floor wax. Do not have throw rugs and other things on the floor that can make you trip. What can I do with my stairs? Do not leave any items on the stairs. Make sure that there are handrails on both sides of the stairs and use them. Fix handrails that are broken or loose. Make sure that handrails are as long as the stairways. Check any carpeting to make sure that it is firmly attached to the stairs. Fix any carpet that is loose or worn. Avoid having throw rugs at the top or bottom of the stairs. If you do have throw rugs, attach them to the floor with carpet  tape. Make sure that you have a light switch at the top of the stairs and the bottom of the stairs. If you do not have them, ask someone to add them for you. What else can I do to help prevent falls? Wear shoes that: Do not have high heels. Have rubber bottoms. Are comfortable and fit you well. Are closed at the toe. Do not wear sandals. If you use a stepladder: Make sure that it is fully opened. Do not climb a closed stepladder. Make sure that both sides of the stepladder are locked into place. Ask someone to hold it for you, if possible. Clearly mark and make sure that you can see: Any grab bars or handrails. First and last steps. Where the edge of each step is. Use tools that help you  move around (mobility aids) if they are needed. These include: Canes. Walkers. Scooters. Crutches. Turn on the lights when you go into a dark area. Replace any light bulbs as soon as they burn out. Set up your furniture so you have a clear path. Avoid moving your furniture around. If any of your floors are uneven, fix them. If there are any pets around you, be aware of where they are. Review your medicines with your doctor. Some medicines can make you feel dizzy. This can increase your chance of falling. Ask your doctor what other things that you can do to help prevent falls. This information is not intended to replace advice given to you by your health care provider. Make sure you discuss any questions you have with your health care provider. Document Released: 02/15/2009 Document Revised: 09/27/2015 Document Reviewed: 05/26/2014 Elsevier Interactive Patient Education  2017 [employer].

## 2021-07-12 ENCOUNTER — Ambulatory Visit: Payer: Medicare Other

## 2021-07-29 DIAGNOSIS — Z1211 Encounter for screening for malignant neoplasm of colon: Secondary | ICD-10-CM | POA: Diagnosis not present

## 2021-08-07 LAB — COLOGUARD: COLOGUARD: NEGATIVE

## 2021-08-07 NOTE — Progress Notes (Signed)
Please let patient know that her Cologuard was negative.  We will repeat it in 3 years.

## 2021-09-10 NOTE — Progress Notes (Signed)
? ?BP 126/80   Pulse 75   Temp 98.5 ?F (36.9 ?C) (Oral)   Wt 278 lb 6.4 oz (126.3 kg)   LMP  (LMP Unknown)   SpO2 95%   BMI 47.79 kg/m?   ? ?Subjective:  ? ? Patient ID: Hannah Edwards, female    DOB: Aug 15, 1950, 71 y.o.   MRN: [id number] ? ?HPI: ?Hannah Edwards is a 71 y.o. female ? ?Chief Complaint  ?Patient presents with  ? Hypertension  ?  6 month follow up   ? Hyperlipidemia  ? Diabetes  ? ?HYPERTENSION / HYPERLIPIDEMIA ?Satisfied with current treatment? yes ?Duration of hypertension: years ?BP monitoring frequency: daily ?BP range: 120-125/70-80 ?BP medication side effects: no ?Past BP meds: none ?Duration of hyperlipidemia: years ?Cholesterol medication side effects: no ?Cholesterol supplements: none ?Past cholesterol medications: none ?Medication compliance:  no medication ?Aspirin: no ?Recent stressors: no ?Recurrent headaches: no ?Visual changes: no ?Palpitations: no ?Dyspnea: no ?Chest pain: no ?Lower extremity edema: no ?Dizzy/lightheaded: no ? ? ? ?Relevant past medical, surgical, family and social history reviewed and updated as indicated. Interim medical history since our last visit reviewed. ?Allergies and medications reviewed and updated. ? ?Review of Systems  ?Eyes:  Negative for visual disturbance.  ?Respiratory:  Negative for cough, chest tightness and shortness of breath.   ?Cardiovascular:  Negative for chest pain, palpitations and leg swelling.  ?Neurological:  Negative for dizziness and headaches.  ? ?Per HPI unless specifically indicated above ? ?   ?Objective:  ?  ?BP 126/80   Pulse 75   Temp 98.5 ?F (36.9 ?C) (Oral)   Wt 278 lb 6.4 oz (126.3 kg)   LMP  (LMP Unknown)   SpO2 95%   BMI 47.79 kg/m?   ?Wt Readings from Last 3 Encounters:  ?09/11/21 278 lb 6.4 oz (126.3 kg)  ?03/13/21 278 lb (126.1 kg)  ?10/10/20 282 lb (127.9 kg)  ?  ?Physical Exam ?Vitals and nursing note reviewed.  ?Constitutional:   ?   General: She is not in acute distress. ?   Appearance: Normal appearance. She is  obese. She is not ill-appearing, toxic-appearing or diaphoretic.  ?HENT:  ?   Head: Normocephalic.  ?   Right Ear: External ear normal.  ?   Left Ear: External ear normal.  ?   Nose: Nose normal.  ?   Mouth/Throat:  ?   Mouth: Mucous membranes are moist.  ?   Pharynx: Oropharynx is clear.  ?Eyes:  ?   General:     ?   Right eye: No discharge.     ?   Left eye: No discharge.  ?   Extraocular Movements: Extraocular movements intact.  ?   Conjunctiva/sclera: Conjunctivae normal.  ?   Pupils: Pupils are equal, round, and reactive to light.  ?Cardiovascular:  ?   Rate and Rhythm: Normal rate and regular rhythm.  ?   Heart sounds: No murmur heard. ?Pulmonary:  ?   Effort: Pulmonary effort is normal. No respiratory distress.  ?   Breath sounds: Normal breath sounds. No wheezing or rales.  ?Musculoskeletal:  ?   Cervical back: Normal range of motion and neck supple.  ?Skin: ?   General: Skin is warm and dry.  ?   Capillary Refill: Capillary refill takes less than 2 seconds.  ?Neurological:  ?   General: No focal deficit present.  ?   Mental Status: She is alert and oriented to person, place, and time. Mental status is at baseline.  ?  Psychiatric:     ?   Mood and Affect: Mood normal.     ?   Behavior: Behavior normal.     ?   Thought Content: Thought content normal.     ?   Judgment: Judgment normal.  ? ? ?Results for orders placed or performed in visit on 07/05/21  ?Cologuard  ?Result Value Ref Range  ? COLOGUARD Negative Negative  ? ?   ?Assessment & Plan:  ? ?Problem List Items Addressed This Visit   ? ?  ? Cardiovascular and Mediastinum  ? Essential hypertension  ?  Chronic.  Controlled without medication. Labs ordered today.  Return to clinic in 6 months for reevaluation.  Call sooner if concerns arise.  ? ? ?  ?  ? Relevant Orders  ? Comp Met (CMET)  ?  ? Endocrine  ? IFG (impaired fasting glucose)  ?  Labs ordered today. Will make recommendations based on lab results. ? ?  ?  ? Relevant Orders  ? HgB A1c  ?  ? Other   ? Hyperlipidemia  ?  Chronic.  Labs ordered today.  Will make recommendations based on lab results.  Return to clinic in 6 months for reevaluation.  Call sooner if concerns arise.  ? ? ?  ?  ? Relevant Orders  ? Lipid Profile  ? BMI 45.0-49.9, adult [facility]) - Primary  ?  Recommend a healthy lifestyle through diet and exercise.  Recommend smaller meals the prioritize protien. ? ?  ?  ?  ? ?Follow up plan: ?Return in about 6 months (around 03/14/2022) for HTN, HLD, DM2 FU. ? ? ? ? ? ? ? ?

## 2021-09-11 ENCOUNTER — Encounter (INDEPENDENT_AMBULATORY_CARE_PROVIDER_SITE_OTHER): Payer: Self-pay

## 2021-09-11 ENCOUNTER — Encounter: Payer: Self-pay | Admitting: Nurse Practitioner

## 2021-09-11 ENCOUNTER — Ambulatory Visit (INDEPENDENT_AMBULATORY_CARE_PROVIDER_SITE_OTHER): Payer: Medicare Other | Admitting: Nurse Practitioner

## 2021-09-11 VITALS — BP 126/80 | HR 75 | Temp 98.5°F | Wt 278.4 lb

## 2021-09-11 DIAGNOSIS — E7800 Pure hypercholesterolemia, unspecified: Secondary | ICD-10-CM

## 2021-09-11 DIAGNOSIS — R7301 Impaired fasting glucose: Secondary | ICD-10-CM

## 2021-09-11 DIAGNOSIS — Z6841 Body Mass Index (BMI) 40.0 and over, adult: Secondary | ICD-10-CM | POA: Diagnosis not present

## 2021-09-11 DIAGNOSIS — I10 Essential (primary) hypertension: Secondary | ICD-10-CM | POA: Diagnosis not present

## 2021-09-11 NOTE — Assessment & Plan Note (Signed)
Labs ordered today.  Will make recommendations based on lab results. ?

## 2021-09-11 NOTE — Assessment & Plan Note (Signed)
Chronic.  Controlled without medication..  Labs ordered today.  Return to clinic in 6 months for reevaluation.  Call sooner if concerns arise.  ° °

## 2021-09-11 NOTE — Assessment & Plan Note (Signed)
Recommend a healthy lifestyle through diet and exercise.  Recommend smaller meals the prioritize protien. ?

## 2021-09-11 NOTE — Assessment & Plan Note (Signed)
Chronic.  Labs ordered today.  Will make recommendations based on lab results.  Return to clinic in 6 months for reevaluation.  Call sooner if concerns arise.  ? ?

## 2021-09-12 LAB — COMPREHENSIVE METABOLIC PANEL
ALT: 15 IU/L (ref 0–32)
AST: 18 IU/L (ref 0–40)
Albumin/Globulin Ratio: 1.5 (ref 1.2–2.2)
Albumin: 4.2 g/dL (ref 3.7–4.7)
Alkaline Phosphatase: 80 IU/L (ref 44–121)
BUN/Creatinine Ratio: 12 (ref 12–28)
BUN: 11 mg/dL (ref 8–27)
Bilirubin Total: 0.4 mg/dL (ref 0.0–1.2)
CO2: 25 mmol/L (ref 20–29)
Calcium: 9.6 mg/dL (ref 8.7–10.3)
Chloride: 104 mmol/L (ref 96–106)
Creatinine, Ser: 0.9 mg/dL (ref 0.57–1.00)
Globulin, Total: 2.8 g/dL (ref 1.5–4.5)
Glucose: 103 mg/dL — ABNORMAL HIGH (ref 70–99)
Potassium: 4.2 mmol/L (ref 3.5–5.2)
Sodium: 143 mmol/L (ref 134–144)
Total Protein: 7 g/dL (ref 6.0–8.5)
eGFR: 68 mL/min/{1.73_m2} (ref >59)

## 2021-09-12 LAB — LIPID PANEL
Chol/HDL Ratio: 4.3 ratio (ref 0.0–4.4)
Cholesterol, Total: 208 mg/dL — ABNORMAL HIGH (ref 100–199)
HDL: 48 mg/dL (ref >39)
LDL Chol Calc (NIH): 146 mg/dL — ABNORMAL HIGH (ref 0–99)
Triglycerides: 76 mg/dL (ref 0–149)
VLDL Cholesterol Cal: 14 mg/dL (ref 5–40)

## 2021-09-12 LAB — HEMOGLOBIN A1C
Est. average glucose Bld gHb Est-mCnc: 134 mg/dL
Hgb A1c MFr Bld: 6.3 % — ABNORMAL HIGH (ref 4.8–5.6)

## 2021-09-12 MED ORDER — ROSUVASTATIN CALCIUM 5 MG PO TABS: 5.0000 mg | ORAL_TABLET | Freq: Every day | ORAL | 0 refills | Status: DC

## 2021-09-12 NOTE — Progress Notes (Signed)
Spoke with patient regarding lab results, patient voiced understanding and is willing to try the statin.

## 2021-09-12 NOTE — Progress Notes (Signed)
Medication sent to the pharmacy.

## 2021-09-12 NOTE — Addendum Note (Signed)
Addended by: Larae Grooms on: 09/12/2021 09:53 AM ? ? Modules accepted: Orders ? ?

## 2021-09-12 NOTE — Progress Notes (Signed)
Please let patient know that her cardiac risk score is elevated due to her cholesterol being elevated. I recommend following a low fat diet and exercising.  If she is willing, I recommend restarting a Statin once weekly to see if she is able to tolerate the medication.  I know she has had adverse effects before but it is still beneficial if she can take it even once weekly.  If she agrees I can send this to the pharmacy. ? ?Her A1c increased to 6.3.  Recommend a low carb diet.  Other lab work looksg ood.  No concerns at this time.  Follow up as discussed.  ? ?The 10-year ASCVD risk score (Arnett DK, et al., 2019) is: 13.3% ?  Values used to calculate the score: ?    Age: 71 years ?    Sex: Female ?    Is Non-Hispanic African American: Yes ?    Diabetic: No ?    Tobacco smoker: No ?    Systolic Blood Pressure: 126 mmHg ?    Is BP treated: No ?    HDL Cholesterol: 48 mg/dL ?    Total Cholesterol: 208 mg/dL ?

## 2021-12-01 IMAGING — DX DG CHEST 1V PORT
2 series · 2 of 2 positions shown · non-contrast
Comparison: 12/11/2016

CLINICAL DATA: Dyspnea

EXAM:
PORTABLE CHEST 1 VIEW

[chest ap (1 of 2)]
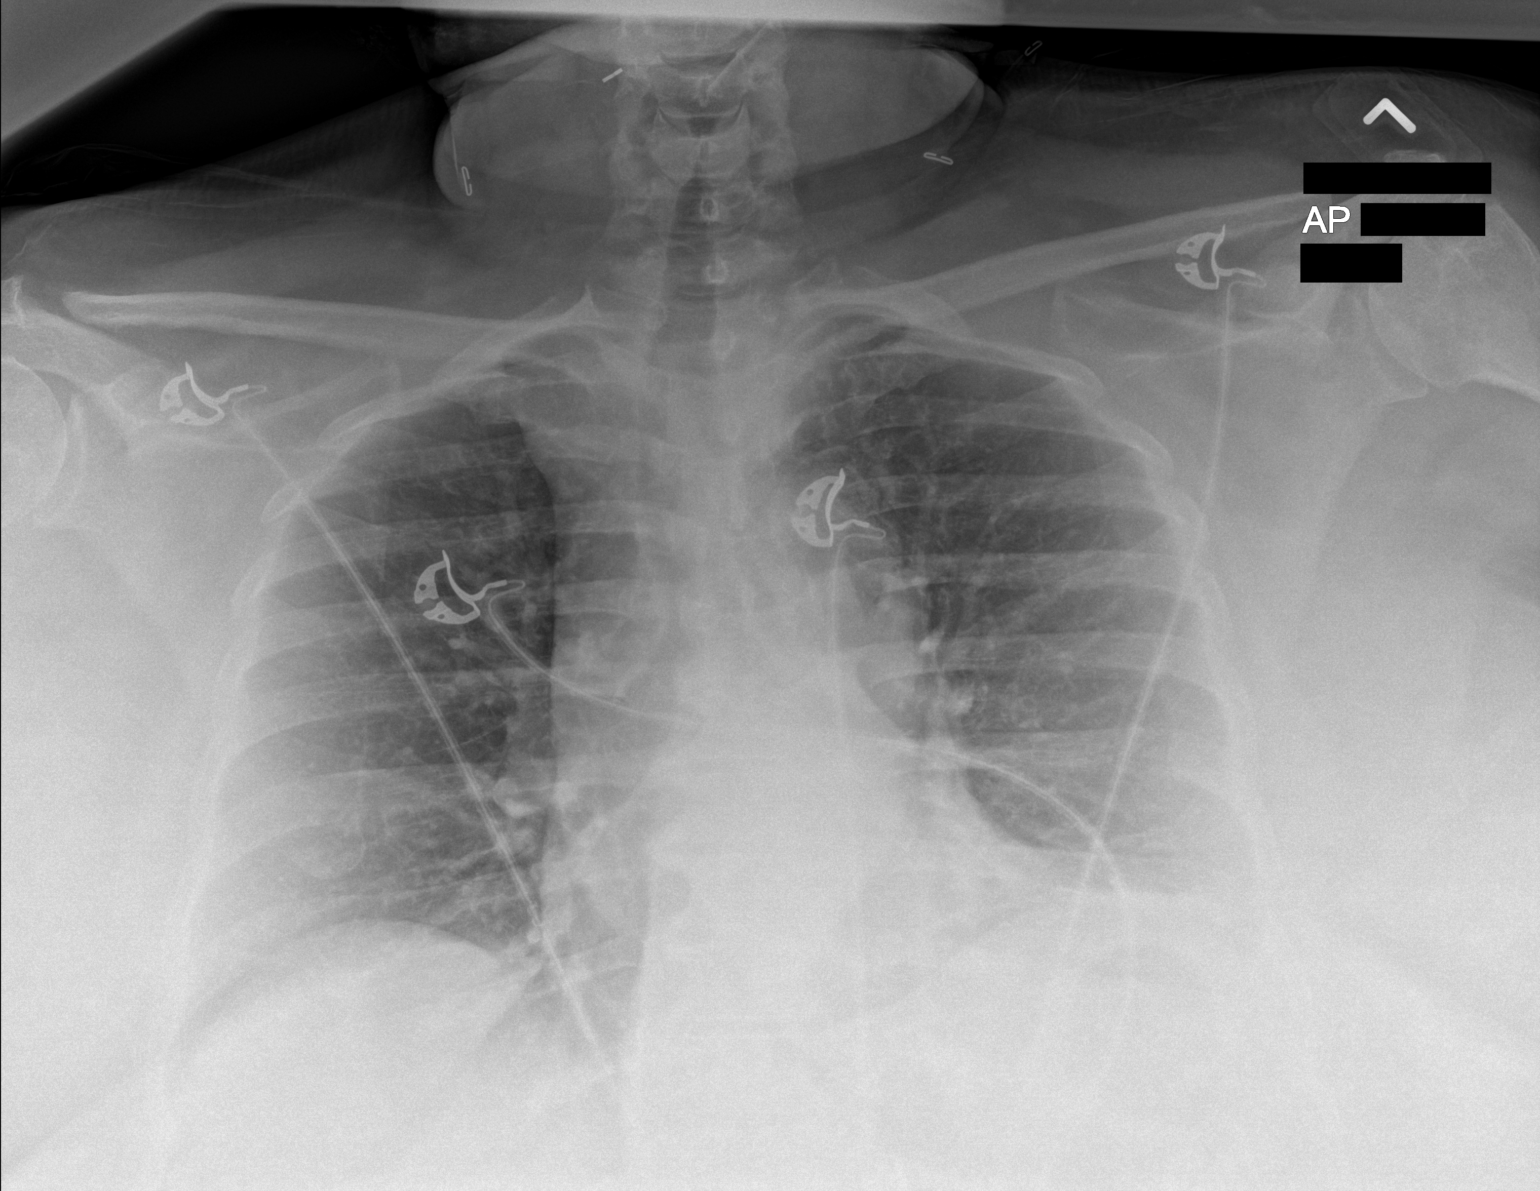

[chest ap (2 of 2)]
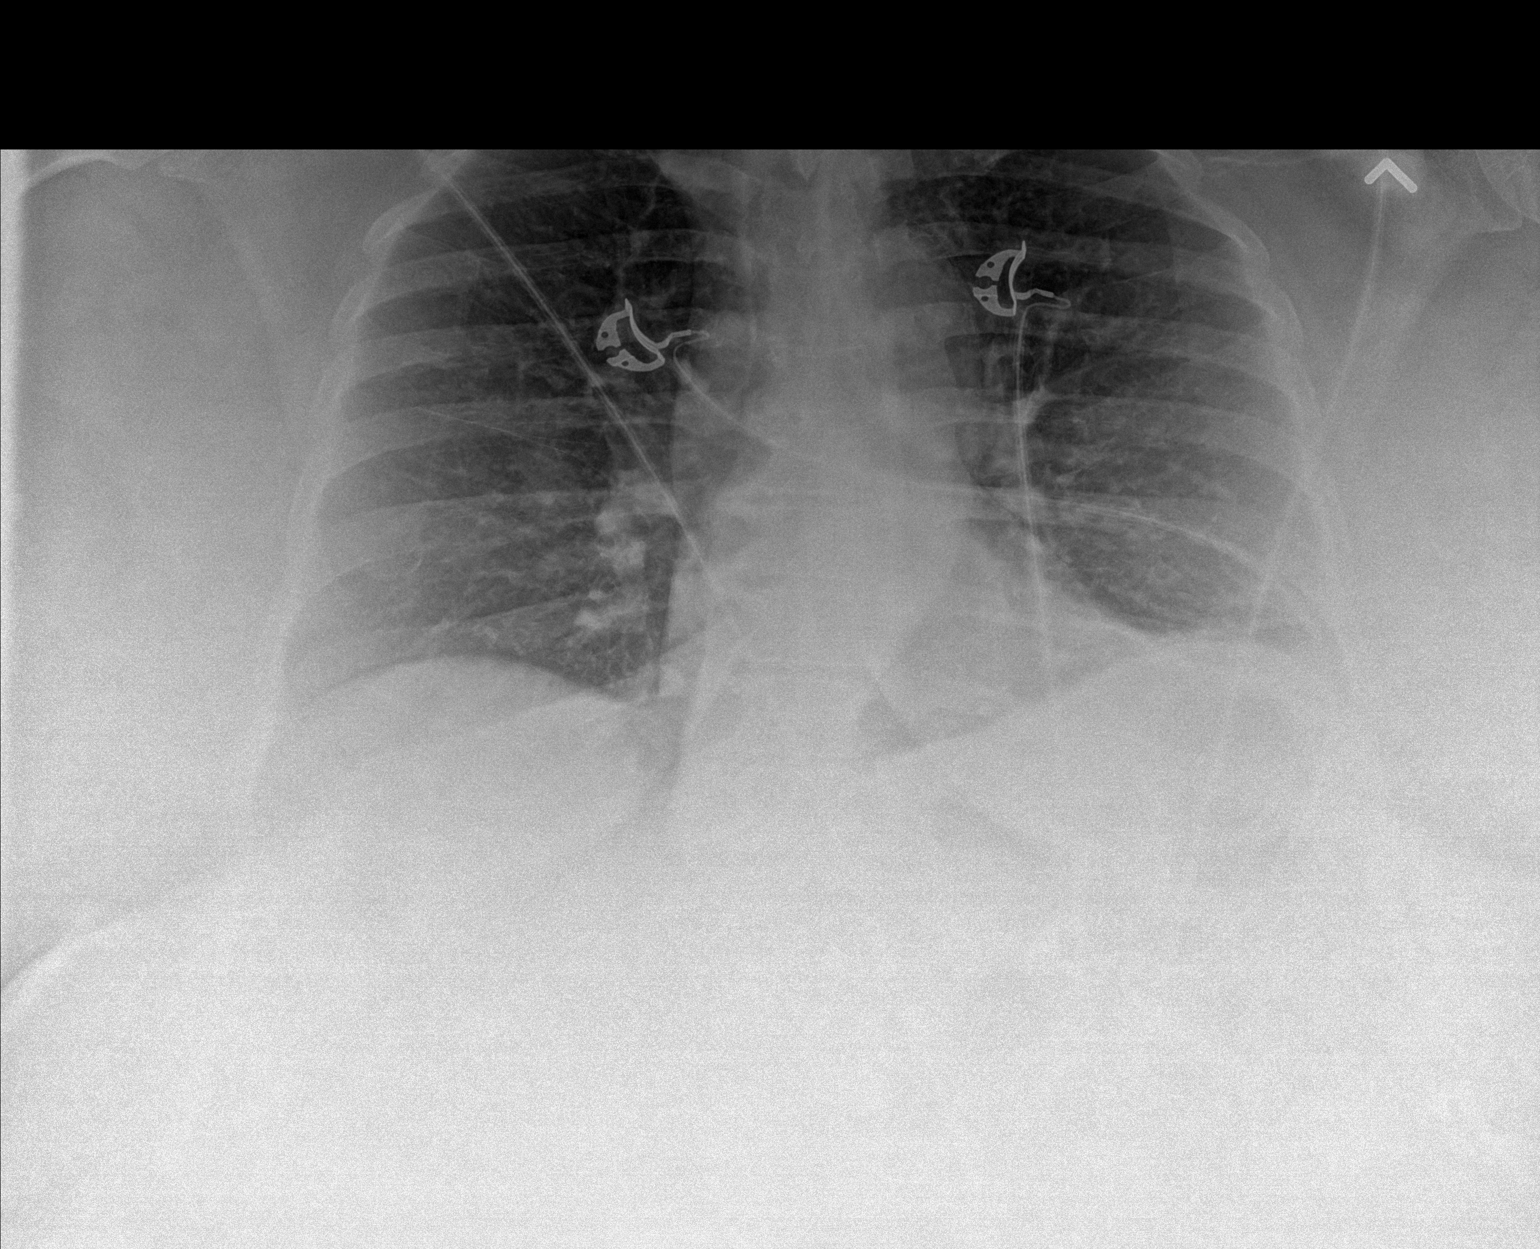

[2 of 2 positions shown; findings below may reference images not displayed]

FINDINGS: The heart size and mediastinal contours are within normal limits.
Both lungs are clear. The visualized skeletal structures are
unremarkable.
IMPRESSION: No active disease.

## 2022-03-15 DIAGNOSIS — Z23 Encounter for immunization: Secondary | ICD-10-CM | POA: Diagnosis not present

## 2022-03-17 NOTE — Progress Notes (Unsigned)
LMP  (LMP Unknown)    Subjective:    Patient ID: Hannah Edwards, female    DOB: 01-14-1951, 71 y.o.   MRN: [id number]  HPI: Hannah Edwards is a 71 y.o. female  No chief complaint on file.  HYPERTENSION / HYPERLIPIDEMIA Satisfied with current treatment? yes Duration of hypertension: years BP monitoring frequency: daily BP range: 120-125/70-80 BP medication side effects: no Past BP meds: none Duration of hyperlipidemia: years Cholesterol medication side effects: no Cholesterol supplements: none Past cholesterol medications: none Medication compliance:  no medication Aspirin: no Recent stressors: no Recurrent headaches: no Visual changes: no Palpitations: no Dyspnea: no Chest pain: no Lower extremity edema: no Dizzy/lightheaded: no    Relevant past medical, surgical, family and social history reviewed and updated as indicated. Interim medical history since our last visit reviewed. Allergies and medications reviewed and updated.  Review of Systems  Eyes:  Negative for visual disturbance.  Respiratory:  Negative for cough, chest tightness and shortness of breath.   Cardiovascular:  Negative for chest pain, palpitations and leg swelling.  Neurological:  Negative for dizziness and headaches.   Per HPI unless specifically indicated above     Objective:    LMP  (LMP Unknown)   Wt Readings from Last 3 Encounters:  09/11/21 278 lb 6.4 oz (126.3 kg)  03/13/21 278 lb (126.1 kg)  10/10/20 282 lb (127.9 kg)    Physical Exam Vitals and nursing note reviewed.  Constitutional:      General: She is not in acute distress.    Appearance: Normal appearance. She is obese. She is not ill-appearing, toxic-appearing or diaphoretic.  HENT:     Head: Normocephalic.     Right Ear: External ear normal.     Left Ear: External ear normal.     Nose: Nose normal.     Mouth/Throat:     Mouth: Mucous membranes are moist.     Pharynx: Oropharynx is clear.  Eyes:     General:        Right  eye: No discharge.        Left eye: No discharge.     Extraocular Movements: Extraocular movements intact.     Conjunctiva/sclera: Conjunctivae normal.     Pupils: Pupils are equal, round, and reactive to light.  Cardiovascular:     Rate and Rhythm: Normal rate and regular rhythm.     Heart sounds: No murmur heard. Pulmonary:     Effort: Pulmonary effort is normal. No respiratory distress.     Breath sounds: Normal breath sounds. No wheezing or rales.  Musculoskeletal:     Cervical back: Normal range of motion and neck supple.  Skin:    General: Skin is warm and dry.     Capillary Refill: Capillary refill takes less than 2 seconds.  Neurological:     General: No focal deficit present.     Mental Status: She is alert and oriented to person, place, and time. Mental status is at baseline.  Psychiatric:        Mood and Affect: Mood normal.        Behavior: Behavior normal.        Thought Content: Thought content normal.        Judgment: Judgment normal.    Results for orders placed or performed in visit on 09/11/21  Comp Met (CMET)  Result Value Ref Range   Glucose 103 (H) 70 - 99 mg/dL   BUN 11 8 - 27 mg/dL   Creatinine,  Ser 0.90 0.57 - 1.00 mg/dL   eGFR 68 >59 mL/min/1.73   BUN/Creatinine Ratio 12 12 - 28   Sodium 143 134 - 144 mmol/L   Potassium 4.2 3.5 - 5.2 mmol/L   Chloride 104 96 - 106 mmol/L   CO2 25 20 - 29 mmol/L   Calcium 9.6 8.7 - 10.3 mg/dL   Total Protein 7.0 6.0 - 8.5 g/dL   Albumin 4.2 3.7 - 4.7 g/dL   Globulin, Total 2.8 1.5 - 4.5 g/dL   Albumin/Globulin Ratio 1.5 1.2 - 2.2   Bilirubin Total 0.4 0.0 - 1.2 mg/dL   Alkaline Phosphatase 80 44 - 121 IU/L   AST 18 0 - 40 IU/L   ALT 15 0 - 32 IU/L  Lipid Profile  Result Value Ref Range   Cholesterol, Total 208 (H) 100 - 199 mg/dL   Triglycerides 76 0 - 149 mg/dL   HDL 48 >39 mg/dL   VLDL Cholesterol Cal 14 5 - 40 mg/dL   LDL Chol Calc (NIH) 146 (H) 0 - 99 mg/dL   Chol/HDL Ratio 4.3 0.0 - 4.4 ratio  HgB  A1c  Result Value Ref Range   Hgb A1c MFr Bld 6.3 (H) 4.8 - 5.6 %   Est. average glucose Bld gHb Est-mCnc 134 mg/dL      Assessment & Plan:   Problem List Items Addressed This Visit      Cardiovascular and Mediastinum   Essential hypertension - Primary     Follow up plan: No follow-ups on file.

## 2022-03-18 ENCOUNTER — Encounter: Payer: Self-pay | Admitting: Nurse Practitioner

## 2022-03-18 ENCOUNTER — Ambulatory Visit (INDEPENDENT_AMBULATORY_CARE_PROVIDER_SITE_OTHER): Payer: Medicare Other | Admitting: Nurse Practitioner

## 2022-03-18 VITALS — BP 133/78 | HR 74 | Temp 98.4°F | Wt 270.7 lb

## 2022-03-18 DIAGNOSIS — R7301 Impaired fasting glucose: Secondary | ICD-10-CM

## 2022-03-18 DIAGNOSIS — Z6841 Body Mass Index (BMI) 40.0 and over, adult: Secondary | ICD-10-CM | POA: Diagnosis not present

## 2022-03-18 DIAGNOSIS — I10 Essential (primary) hypertension: Secondary | ICD-10-CM | POA: Diagnosis not present

## 2022-03-18 DIAGNOSIS — E7800 Pure hypercholesterolemia, unspecified: Secondary | ICD-10-CM | POA: Diagnosis not present

## 2022-03-18 MED ORDER — OMEPRAZOLE 20 MG PO CPDR: 20.0000 mg | DELAYED_RELEASE_CAPSULE | Freq: Every day | ORAL | 3 refills | Status: AC

## 2022-03-18 NOTE — Assessment & Plan Note (Signed)
Chronic.  Controlled.  Continue with current medication regimen of Crestor weekly.  Labs ordered today.  Return to clinic in 6 months for reevaluation.  Call sooner if concerns arise.

## 2022-03-18 NOTE — Assessment & Plan Note (Signed)
Recommended eating smaller high protein, low fat meals more frequently and exercising 30 mins a day 5 times a week with a goal of 10-15lb weight loss in the next 3 months. Patient voiced their understanding and motivation to adhere to these recommendations.  

## 2022-03-18 NOTE — Assessment & Plan Note (Signed)
Labs ordered at visit today.  Will make recommendations based on lab results.   

## 2022-03-18 NOTE — Assessment & Plan Note (Signed)
Chronic.  Controlled without medication. Checks blood pressure regularly at home, runs 116-130/70-80.  Labs ordered today.  Return to clinic in 6 months for reevaluation.  Call sooner if concerns arise.

## 2022-03-19 LAB — LIPID PANEL
Chol/HDL Ratio: 4.1 ratio (ref 0.0–4.4)
Cholesterol, Total: 169 mg/dL (ref 100–199)
HDL: 41 mg/dL (ref >39)
LDL Chol Calc (NIH): 113 mg/dL — ABNORMAL HIGH (ref 0–99)
Triglycerides: 77 mg/dL (ref 0–149)
VLDL Cholesterol Cal: 15 mg/dL (ref 5–40)

## 2022-03-19 LAB — HEMOGLOBIN A1C
Est. average glucose Bld gHb Est-mCnc: 131 mg/dL
Hgb A1c MFr Bld: 6.2 % — ABNORMAL HIGH (ref 4.8–5.6)

## 2022-03-19 LAB — COMPREHENSIVE METABOLIC PANEL
ALT: 10 IU/L (ref 0–32)
AST: 17 IU/L (ref 0–40)
Albumin/Globulin Ratio: 1.6 (ref 1.2–2.2)
Albumin: 4.1 g/dL (ref 3.8–4.8)
Alkaline Phosphatase: 81 IU/L (ref 44–121)
BUN/Creatinine Ratio: 12 (ref 12–28)
BUN: 10 mg/dL (ref 8–27)
Bilirubin Total: 0.2 mg/dL (ref 0.0–1.2)
CO2: 25 mmol/L (ref 20–29)
Calcium: 9.1 mg/dL (ref 8.7–10.3)
Chloride: 104 mmol/L (ref 96–106)
Creatinine, Ser: 0.82 mg/dL (ref 0.57–1.00)
Globulin, Total: 2.6 g/dL (ref 1.5–4.5)
Glucose: 101 mg/dL — ABNORMAL HIGH (ref 70–99)
Potassium: 4.5 mmol/L (ref 3.5–5.2)
Sodium: 145 mmol/L — ABNORMAL HIGH (ref 134–144)
Total Protein: 6.7 g/dL (ref 6.0–8.5)
eGFR: 76 mL/min/{1.73_m2} (ref >59)

## 2022-03-19 NOTE — Progress Notes (Signed)
Hi Hannah Edwards. It was nice to see you yesterday.  Your lab work looks good.  Your cholesterol has improved.  Your A1c is well controlled at 6.2.  Keep up the good work.  No concerns at this time. Continue with your current medication regimen.  Follow up as discussed.  Please let me know if you have any questions.

## 2022-06-26 ENCOUNTER — Telehealth: Payer: Self-pay | Admitting: Nurse Practitioner

## 2022-06-26 NOTE — Telephone Encounter (Signed)
Contacted Hannah Edwards to schedule their annual wellness visit. Appointment made for 07/21/2022.  Sherol Dade; Care Guide Ambulatory Clinical [facility] Direct Dial: [phone]

## 2022-07-21 ENCOUNTER — Ambulatory Visit (INDEPENDENT_AMBULATORY_CARE_PROVIDER_SITE_OTHER): Payer: Medicare Other

## 2022-07-21 VITALS — Wt 270.0 lb

## 2022-07-21 DIAGNOSIS — Z1231 Encounter for screening mammogram for malignant neoplasm of breast: Secondary | ICD-10-CM | POA: Diagnosis not present

## 2022-07-21 DIAGNOSIS — Z0000 Encounter for general adult medical examination without abnormal findings: Secondary | ICD-10-CM | POA: Diagnosis not present

## 2022-07-21 NOTE — Patient Instructions (Signed)
Hannah Edwards , Thank you for taking time to come for your Medicare Wellness Visit. I appreciate your ongoing commitment to your health goals. Please review the following plan we discussed and let me know if I can assist you in the future.   These are the goals we discussed:  Goals      DIET - EAT MORE FRUITS AND VEGETABLES     DIET - INCREASE WATER INTAKE     Recommend drinking at least 6-8 glasses of water a day      Patient Stated     07/09/2020, stay well     Patient Stated     Would like to not be working        This is a list of the screening recommended for you and due dates:  Health Maintenance  Topic Date Due   DTaP/Tdap/Td vaccine (1 - Tdap) Never done   Zoster (Shingles) Vaccine (1 of 2) Never done   Mammogram  04/03/2022   COVID-19 Vaccine (5 - 2023-24 season) 05/10/2022   Medicare Annual Wellness Visit  07/21/2023   Cologuard (Stool DNA test)  07/29/2024   Pneumonia Vaccine  Completed   Flu Shot  Completed   DEXA scan (bone density measurement)  Completed   Hepatitis C Screening: USPSTF Recommendation to screen - Ages 5-79 yo.  Completed   HPV Vaccine  Aged Out   Colon Cancer Screening  Discontinued    Advanced directives: no  Conditions/risks identified: none  Next appointment: Follow up in one year for your annual wellness visit 07/27/23 @ 10:15 am by phone   Preventive Care 65 Years and Older, Female Preventive care refers to lifestyle choices and visits with your health care provider that can promote health and wellness. What does preventive care include? A yearly physical exam. This is also called an annual well check. Dental exams once or twice a year. Routine eye exams. Ask your health care provider how often you should have your eyes checked. Personal lifestyle choices, including: Daily care of your teeth and gums. Regular physical activity. Eating a healthy diet. Avoiding tobacco and drug use. Limiting alcohol use. Practicing safe sex. Taking  low-dose aspirin every day. Taking vitamin and mineral supplements as recommended by your health care provider. What happens during an annual well check? The services and screenings done by your health care provider during your annual well check will depend on your age, overall health, lifestyle risk factors, and family history of disease. Counseling  Your health care provider may ask you questions about your: Alcohol use. Tobacco use. Drug use. Emotional well-being. Home and relationship well-being. Sexual activity. Eating habits. History of falls. Memory and ability to understand (cognition). Work and work Statistician. Reproductive health. Screening  You may have the following tests or measurements: Height, weight, and BMI. Blood pressure. Lipid and cholesterol levels. These may be checked every 5 years, or more frequently if you are over 19 years old. Skin check. Lung cancer screening. You may have this screening every year starting at age 62 if you have a 30-pack-year history of smoking and currently smoke or have quit within the past 15 years. Fecal occult blood test (FOBT) of the stool. You may have this test every year starting at age 17. Flexible sigmoidoscopy or colonoscopy. You may have a sigmoidoscopy every 5 years or a colonoscopy every 10 years starting at age 29. Hepatitis C blood test. Hepatitis B blood test. Sexually transmitted disease (STD) testing. Diabetes screening. This is done by  checking your blood sugar (glucose) after you have not eaten for a while (fasting). You may have this done every 1-3 years. Bone density scan. This is done to screen for osteoporosis. You may have this done starting at age 82. Mammogram. This may be done every 1-2 years. Talk to your health care provider about how often you should have regular mammograms. Talk with your health care provider about your test results, treatment options, and if necessary, the need for more tests. Vaccines   Your health care provider may recommend certain vaccines, such as: Influenza vaccine. This is recommended every year. Tetanus, diphtheria, and acellular pertussis (Tdap, Td) vaccine. You may need a Td booster every 10 years. Zoster vaccine. You may need this after age 79. Pneumococcal 13-valent conjugate (PCV13) vaccine. One dose is recommended after age 8. Pneumococcal polysaccharide (PPSV23) vaccine. One dose is recommended after age 58. Talk to your health care provider about which screenings and vaccines you need and how often you need them. This information is not intended to replace advice given to you by your health care provider. Make sure you discuss any questions you have with your health care provider. Document Released: 05/18/2015 Document Revised: 01/09/2016 Document Reviewed: 02/20/2015 Elsevier Interactive Patient Education  2017 [place] Prevention in the Home Falls can cause injuries. They can happen to people of all ages. There are many things you can do to make your home safe and to help prevent falls. What can I do on the outside of my home? Regularly fix the edges of walkways and driveways and fix any cracks. Remove anything that might make you trip as you walk through a door, such as a raised step or threshold. Trim any bushes or trees on the path to your home. Use bright outdoor lighting. Clear any walking paths of anything that might make someone trip, such as rocks or tools. Regularly check to see if handrails are loose or broken. Make sure that both sides of any steps have handrails. Any raised decks and porches should have guardrails on the edges. Have any leaves, snow, or ice cleared regularly. Use sand or salt on walking paths during winter. Clean up any spills in your garage right away. This includes oil or grease spills. What can I do in the bathroom? Use night lights. Install grab bars by the toilet and in the tub and shower. Do not use towel  bars as grab bars. Use non-skid mats or decals in the tub or shower. If you need to sit down in the shower, use a plastic, non-slip stool. Keep the floor dry. Clean up any water that spills on the floor as soon as it happens. Remove soap buildup in the tub or shower regularly. Attach bath mats securely with double-sided non-slip rug tape. Do not have throw rugs and other things on the floor that can make you trip. What can I do in the bedroom? Use night lights. Make sure that you have a light by your bed that is easy to reach. Do not use any sheets or blankets that are too big for your bed. They should not hang down onto the floor. Have a firm chair that has side arms. You can use this for support while you get dressed. Do not have throw rugs and other things on the floor that can make you trip. What can I do in the kitchen? Clean up any spills right away. Avoid walking on wet floors. Keep items that you use a  lot in easy-to-reach places. If you need to reach something above you, use a strong step stool that has a grab bar. Keep electrical cords out of the way. Do not use floor polish or wax that makes floors slippery. If you must use wax, use non-skid floor wax. Do not have throw rugs and other things on the floor that can make you trip. What can I do with my stairs? Do not leave any items on the stairs. Make sure that there are handrails on both sides of the stairs and use them. Fix handrails that are broken or loose. Make sure that handrails are as long as the stairways. Check any carpeting to make sure that it is firmly attached to the stairs. Fix any carpet that is loose or worn. Avoid having throw rugs at the top or bottom of the stairs. If you do have throw rugs, attach them to the floor with carpet tape. Make sure that you have a light switch at the top of the stairs and the bottom of the stairs. If you do not have them, ask someone to add them for you. What else can I do to help  prevent falls? Wear shoes that: Do not have high heels. Have rubber bottoms. Are comfortable and fit you well. Are closed at the toe. Do not wear sandals. If you use a stepladder: Make sure that it is fully opened. Do not climb a closed stepladder. Make sure that both sides of the stepladder are locked into place. Ask someone to hold it for you, if possible. Clearly mark and make sure that you can see: Any grab bars or handrails. First and last steps. Where the edge of each step is. Use tools that help you move around (mobility aids) if they are needed. These include: Canes. Walkers. Scooters. Crutches. Turn on the lights when you go into a dark area. Replace any light bulbs as soon as they burn out. Set up your furniture so you have a clear path. Avoid moving your furniture around. If any of your floors are uneven, fix them. If there are any pets around you, be aware of where they are. Review your medicines with your doctor. Some medicines can make you feel dizzy. This can increase your chance of falling. Ask your doctor what other things that you can do to help prevent falls. This information is not intended to replace advice given to you by your health care provider. Make sure you discuss any questions you have with your health care provider. Document Released: 02/15/2009 Document Revised: 09/27/2015 Document Reviewed: 05/26/2014 Elsevier Interactive Patient Education  2017 [employer].

## 2022-07-21 NOTE — Progress Notes (Signed)
I connected with  Hannah Edwards on 07/21/22 by a audio enabled telemedicine application and verified that I am speaking with the correct person using two identifiers.  Patient Location: Home  Provider Location: Office/Clinic  I discussed the limitations of evaluation and management by telemedicine. The patient expressed understanding and agreed to proceed.  Subjective:   Hannah Edwards is a 72 y.o. female who presents for Medicare Annual (Subsequent) preventive examination.  Review of Systems     Cardiac Risk Factors include: advanced age (>52men, >6 women);obesity (BMI >30kg/m2);dyslipidemia     Objective:    Today's Vitals   07/21/22 1041  Weight: 270 lb (122.5 kg)   Body mass index is 46.35 kg/m.     07/21/2022   10:32 AM 07/11/2021   12:52 PM 07/09/2020    1:02 PM 07/04/2019    1:08 PM 06/30/2019    6:00 AM 06/29/2019   10:14 PM 01/13/2018    1:15 PM  Advanced Directives  Does Patient Have a Medical Advance Directive? No No Yes No No No No  Type of Advance Directive   Living will      Would patient like information on creating a medical advance directive? No - Patient declined No - Patient declined   No - Patient declined  Yes (MAU/Ambulatory/Procedural Areas - Information given)    Current Medications (verified) Outpatient Encounter Medications as of 07/21/2022  Medication Sig   acetaminophen (TYLENOL) 500 MG tablet Take 1,000 mg by mouth every 6 (six) hours as needed.   omeprazole (PRILOSEC) 20 MG capsule Take 1 capsule (20 mg total) by mouth daily.   rosuvastatin (CRESTOR) 5 MG tablet Take 1 tablet (5 mg total) by mouth daily. Take 1 tab once weekly.   No facility-administered encounter medications on file as of 07/21/2022.    Allergies (verified) Ace inhibitors, Lisinopril, and Atorvastatin   History: History reviewed. No pertinent past medical history. Past Surgical History:  Procedure Laterality Date   ABDOMINAL HYSTERECTOMY     WRIST SURGERY Left    Family  History  Problem Relation Age of Onset   Hypertension Mother    Cancer Mother        unknown type    Hypertension Father    Aneurysm Father    Stomach cancer Sister    Diabetes Brother    HIV/AIDS Brother    Breast cancer Neg Hx    Lung cancer Neg Hx    Social History   Socioeconomic History   Marital status: Widowed    Spouse name: Not on file   Number of children: Not on file   Years of education: Not on file   Highest education level: Bachelor's degree (e.g., BA, AB, BS)  Occupational History   Not on file  Tobacco Use   Smoking status: Former    Types: Cigarettes    Quit date: 01/05/1975    Years since quitting: 47.5   Smokeless tobacco: Never  Vaping Use   Vaping Use: Never used  Substance and Sexual Activity   Alcohol use: No    Alcohol/week: 0.0 standard drinks of alcohol   Drug use: No   Sexual activity: Not Currently  Other Topics Concern   Not on file  Social History Narrative   Working full time    Social Determinants of Health   Financial Resource Strain: Low Risk  (07/21/2022)   Overall Financial Resource Strain (CARDIA)    Difficulty of Paying Living Expenses: Not hard at all  Food Insecurity: No Food  Insecurity (07/21/2022)   Hunger Vital Sign    Worried About Running Out of Food in the Last Year: Never true    [place] in the Last Year: Never true  Transportation Needs: No Transportation Needs (07/21/2022)   PRAPARE - Hydrologist (Medical): No    Lack of Transportation (Non-Medical): No  Physical Activity: Insufficiently Active (07/21/2022)   Exercise Vital Sign    Days of Exercise per Week: 4 days    Minutes of Exercise per Session: 30 min  Stress: No Stress Concern Present (07/21/2022)   [place]    Feeling of Stress : Not at all  Social Connections: Moderately Isolated (07/21/2022)   Social Connection and Isolation Panel [NHANES]     Frequency of Communication with Friends and Family: More than three times a week    Frequency of Social Gatherings with Friends and Family: More than three times a week    Attends Religious Services: More than 4 times per year    Active Member of [employer] or Organizations: No    Attends Archivist Meetings: Never    Marital Status: Widowed    Tobacco Counseling Counseling given: Not Answered   Clinical Intake:  Pre-visit preparation completed: Yes  Pain : No/denies pain     Nutritional Risks: None Diabetes: No  How often do you need to have someone help you when you read instructions, pamphlets, or other written materials from your doctor or pharmacy?: 1 - Never  Diabetic?no  Interpreter Needed?: No  Information entered by :: Kirke Shaggy, LPN   Activities of Daily Living    07/21/2022   10:32 AM  In your present state of health, do you have any difficulty performing the following activities:  Hearing? 0  Vision? 0  Difficulty concentrating or making decisions? 0  Walking or climbing stairs? 0  Dressing or bathing? 0  Doing errands, shopping? 0  Preparing Food and eating ? N  Using the Toilet? N  In the past six months, have you accidently leaked urine? N  Do you have problems with loss of bowel control? N  Managing your Medications? N  Managing your Finances? N  Housekeeping or managing your Housekeeping? N    Patient Care Team: Jon Billings, NP as PCP - General  Indicate any recent Medical Services you may have received from other than [facility] providers in the past year (date may be approximate).     Assessment:   This is a routine wellness examination for Hannah Edwards.  Hearing/Vision screen Hearing Screening - Comments:: No aids Vision Screening - Comments:: Wears glasses- [facility]  Dietary issues and exercise activities discussed: Current Exercise Habits: Home exercise routine, Type of exercise: walking, Time (Minutes): 30, Frequency  (Times/Week): 4, Weekly Exercise (Minutes/Week): 120, Intensity: Mild   Goals Addressed             This Visit's Progress    DIET - EAT MORE FRUITS AND VEGETABLES         Depression Screen    07/21/2022   10:30 AM 03/18/2022   11:03 AM 09/11/2021   11:01 AM 07/11/2021    1:10 PM 07/11/2021   12:53 PM 03/13/2021   10:56 AM 07/09/2020    1:03 PM  PHQ 2/9 Scores  PHQ - 2 Score 0 0 0 0 0 0 0  PHQ- 9 Score 0 0 0  Fall Risk    07/21/2022   10:32 AM 03/18/2022   11:03 AM 09/11/2021   11:01 AM 07/11/2021   12:51 PM 03/13/2021   10:57 AM  Fall Risk   Falls in the past year? 0 0 0 0 0  Number falls in past yr: 0 0 0 0 0  Injury with Fall? 0 0 0 0 0  Risk for fall due to : No Fall Risks No Fall Risks No Fall Risks  No Fall Risks  Follow up Falls prevention discussed;Falls evaluation completed Falls evaluation completed Falls evaluation completed Falls evaluation completed;Falls prevention discussed;Education provided Falls evaluation completed    FALL RISK PREVENTION PERTAINING TO THE HOME:  Any stairs in or around the home? Yes  If so, are there any without handrails? No  Home free of loose throw rugs in walkways, pet beds, electrical cords, etc? Yes  Adequate lighting in your home to reduce risk of falls? Yes   ASSISTIVE DEVICES UTILIZED TO PREVENT FALLS:  Life alert? No  Use of a cane, walker or w/c? No  Grab bars in the bathroom? No  Shower chair or bench in shower? No  Elevated toilet seat or a handicapped toilet? Yes   Cognitive Function:        07/21/2022   10:36 AM 07/09/2020    1:05 PM 01/13/2018    1:18 PM  6CIT Screen  What Year? 0 points 0 points 0 points  What month? 0 points 0 points 0 points  What time? 0 points 0 points 0 points  Count back from 20 0 points 0 points 0 points  Months in reverse 0 points 0 points 0 points  Repeat phrase 0 points 0 points 0 points  Total Score 0 points 0 points 0 points    Immunizations Immunization History   Administered Date(s) Administered   Covid-19, Mrna,Vaccine(Spikevax)71yrs and older 03/15/2022   Fluad Quad(high Dose 65+) 02/08/2019, 02/29/2020, 02/13/2021   Influenza, High Dose Seasonal PF 01/13/2018   Influenza,inj,quad, With Preservative 03/15/2022   Influenza-Unspecified 02/12/2015   Moderna Sars-Covid-2 Vaccination 03/15/2022   PFIZER(Purple Top)SARS-COV-2 Vaccination 09/28/2019, 10/19/2019, 04/11/2020   PPD Test 09/09/2013   Pneumococcal Conjugate-13 12/10/2016   Pneumococcal Polysaccharide-23 01/13/2018   Zoster, Live 06/17/2010    TDAP status: Due, Education has been provided regarding the importance of this vaccine. Advised may receive this vaccine at local pharmacy or Health Dept. Aware to provide a copy of the vaccination record if obtained from local pharmacy or Health Dept. Verbalized acceptance and understanding.  Flu Vaccine status: Up to date  Pneumococcal vaccine status: Up to date  Covid-19 vaccine status: Completed vaccines  Qualifies for Shingles Vaccine? Yes   Zostavax completed Yes   Shingrix Completed?: No.    Education has been provided regarding the importance of this vaccine. Patient has been advised to call insurance company to determine out of pocket expense if they have not yet received this vaccine. Advised may also receive vaccine at local pharmacy or Health Dept. Verbalized acceptance and understanding.  Screening Tests Health Maintenance  Topic Date Due   DTaP/Tdap/Td (1 - Tdap) Never done   Zoster Vaccines- Shingrix (1 of 2) Never done   MAMMOGRAM  04/03/2022   COVID-19 Vaccine (5 - 2023-24 season) 05/10/2022   Medicare Annual Wellness (AWV)  07/21/2023   Fecal DNA (Cologuard)  07/29/2024   Pneumonia Vaccine 51+ Years old  Completed   INFLUENZA VACCINE  Completed   DEXA SCAN  Completed   Hepatitis C Screening  Completed   HPV VACCINES  Aged Out   COLONOSCOPY (Pts 45-79yrs Insurance coverage will need to be confirmed)  Discontinued     Health Maintenance  Health Maintenance Due  Topic Date Due   DTaP/Tdap/Td (1 - Tdap) Never done   Zoster Vaccines- Shingrix (1 of 2) Never done   MAMMOGRAM  04/03/2022   COVID-19 Vaccine (5 - 2023-24 season) 05/10/2022    Colorectal cancer screening: Type of screening: Cologuard. Completed 07/29/21. Repeat every 3 years  Mammogram status: Ordered 07/21/22. Pt provided with contact info and advised to call to schedule appt.   Bone Density status: Completed 01/22/17. Results reflect: Bone density results: NORMAL. Repeat every 5 years.- declined referral  Lung Cancer Screening: (Low Dose CT Chest recommended if Age 73-80 years, 30 pack-year currently smoking OR have quit w/in 15years.) does not qualify.   Additional Screening:  Hepatitis C Screening: does qualify; Completed 05/11/15  Vision Screening: Recommended annual ophthalmology exams for early detection of glaucoma and other disorders of the eye. Is the patient up to date with their annual eye exam?  Yes  Who is the provider or what is the name of the office in which the patient attends annual eye exams? [facility] If pt is not established with a provider, would they like to be referred to a provider to establish care? No .   Dental Screening: Recommended annual dental exams for proper oral hygiene  Community Resource Referral / Chronic Care Management: CRR required this visit?  No   CCM required this visit?  No      Plan:     I have personally reviewed and noted the following in the patient's chart:   Medical and social history Use of alcohol, tobacco or illicit drugs  Current medications and supplements including opioid prescriptions. Patient is not currently taking opioid prescriptions. Functional ability and status Nutritional status Physical activity Advanced directives List of other physicians Hospitalizations, surgeries, and ER visits in previous 12 months Vitals Screenings to include cognitive,  depression, and falls Referrals and appointments  In addition, I have reviewed and discussed with patient certain preventive protocols, quality metrics, and best practice recommendations. A written personalized care plan for preventive services as well as general preventive health recommendations were provided to patient.     Dionisio David, LPN   [id number]   Nurse Notes: none

## 2022-08-27 ENCOUNTER — Other Ambulatory Visit: Payer: Self-pay | Admitting: Nurse Practitioner

## 2022-08-27 NOTE — Telephone Encounter (Signed)
Requested Prescriptions  Pending Prescriptions Disp Refills   rosuvastatin (CRESTOR) 5 MG tablet [Pharmacy Med Name: Rosuvastatin Calcium 5 MG Oral Tablet] 15 tablet 0    Sig: Take 1 tablet by mouth once a week     Cardiovascular:  Antilipid - Statins 2 Failed - 08/27/2022  1:18 PM      Failed - Lipid Panel in normal range within the last 12 months    Cholesterol, Total  Date Value Ref Range Status  03/18/2022 169 100 - 199 mg/dL Final   Cholesterol Piccolo, Waived  Date Value Ref Range Status  01/05/2015 186 <200 mg/dL Final    Comment:                            Desirable                <200                         Borderline High      200- 239                         High                     >239    LDL Chol Calc (NIH)  Date Value Ref Range Status  03/18/2022 113 (H) 0 - 99 mg/dL Final   HDL  Date Value Ref Range Status  03/18/2022 41 >39 mg/dL Final   Triglycerides  Date Value Ref Range Status  03/18/2022 77 0 - 149 mg/dL Final   Triglycerides Piccolo,Waived  Date Value Ref Range Status  01/05/2015 73 <150 mg/dL Final    Comment:                            Normal                   <150                         Borderline High     150 - 199                         High                200 - 499                         Very High                >499          Passed - Cr in normal range and within 360 days    Creatinine, Ser  Date Value Ref Range Status  03/18/2022 0.82 0.57 - 1.00 mg/dL Final         Passed - Patient is not pregnant      Passed - Valid encounter within last 12 months    Recent Outpatient Visits           5 months ago BMI 45.0-49.9, adult [facility])   [place] [facility] Larae Grooms, NP   11 months ago BMI 45.0-49.9, adult [facility])   [place] [facility] Larae Grooms, NP   1 year ago BMI 45.0-49.9, adult (HCC)  [place] [facility] Larae Grooms, NP   1 year ago Morbid obesity [facility])    [place] [facility] Larae Grooms, NP   1 year ago Essential hypertension   [place] [facility] Larae Grooms, NP       Future Appointments             In 2 weeks Larae Grooms, NP  [facility]

## 2022-09-02 DIAGNOSIS — Z23 Encounter for immunization: Secondary | ICD-10-CM | POA: Diagnosis not present

## 2022-09-09 ENCOUNTER — Other Ambulatory Visit: Payer: Self-pay | Admitting: Nurse Practitioner

## 2022-09-09 DIAGNOSIS — N63 Unspecified lump in unspecified breast: Secondary | ICD-10-CM

## 2022-09-16 ENCOUNTER — Ambulatory Visit: Payer: Medicare Other | Admitting: Nurse Practitioner

## 2022-09-16 NOTE — Progress Notes (Deleted)
LMP  (LMP Unknown)    Subjective:    Patient ID: Hannah Edwards, female    DOB: 1951-01-22, 72 y.o.   MRN: [id number]  HPI: [name] is a 72 y.o. female  No chief complaint on file.  HYPERTENSION / HYPERLIPIDEMIA Satisfied with current treatment? yes Duration of hypertension: years BP monitoring frequency: daily BP range: 116-125/70-80 BP medication side effects: no Past BP meds: none Duration of hyperlipidemia: years Cholesterol medication side effects: no Cholesterol supplements: crestor weekly Past cholesterol medications: none Medication compliance: good compliance Aspirin: no Recent stressors: no Recurrent headaches: no Visual changes: no Palpitations: no Dyspnea: no Chest pain: no Lower extremity edema: no Dizzy/lightheaded: no    Relevant past medical, surgical, family and social history reviewed and updated as indicated. Interim medical history since our last visit reviewed. Allergies and medications reviewed and updated.  Review of Systems  Eyes:  Negative for visual disturbance.  Respiratory:  Negative for cough, chest tightness and shortness of breath.   Cardiovascular:  Negative for chest pain, palpitations and leg swelling.  Neurological:  Negative for dizziness and headaches.    Per HPI unless specifically indicated above     Objective:    LMP  (LMP Unknown)   Wt Readings from Last 3 Encounters:  07/21/22 270 lb (122.5 kg)  03/18/22 270 lb 11.2 oz (122.8 kg)  09/11/21 278 lb 6.4 oz (126.3 kg)    Physical Exam Vitals and nursing note reviewed.  Constitutional:      General: She is not in acute distress.    Appearance: Normal appearance. She is not ill-appearing, toxic-appearing or diaphoretic.  HENT:     Head: Normocephalic.     Right Ear: External ear normal.     Left Ear: External ear normal.     Nose: Nose normal.     Mouth/Throat:     Mouth: Mucous membranes are moist.     Pharynx: Oropharynx is clear.  Eyes:     General:         Right eye: No discharge.        Left eye: No discharge.     Extraocular Movements: Extraocular movements intact.     Conjunctiva/sclera: Conjunctivae normal.     Pupils: Pupils are equal, round, and reactive to light.  Cardiovascular:     Rate and Rhythm: Normal rate and regular rhythm.     Heart sounds: No murmur heard. Pulmonary:     Effort: Pulmonary effort is normal. No respiratory distress.     Breath sounds: Normal breath sounds. No wheezing or rales.  Musculoskeletal:     Cervical back: Normal range of motion and neck supple.  Skin:    General: Skin is warm and dry.     Capillary Refill: Capillary refill takes less than 2 seconds.  Neurological:     General: No focal deficit present.     Mental Status: She is alert and oriented to person, place, and time. Mental status is at baseline.  Psychiatric:        Mood and Affect: Mood normal.        Behavior: Behavior normal.        Thought Content: Thought content normal.        Judgment: Judgment normal.    Results for orders placed or performed in visit on 03/18/22  Comp Met (CMET)  Result Value Ref Range   Glucose 101 (H) 70 - 99 mg/dL   BUN 10 8 - 27 mg/dL   Creatinine,  Ser 0.82 0.57 - 1.00 mg/dL   eGFR 76 >09 WJ/XBJ/4.78   BUN/Creatinine Ratio 12 12 - 28   Sodium 145 (H) 134 - 144 mmol/L   Potassium 4.5 3.5 - 5.2 mmol/L   Chloride 104 96 - 106 mmol/L   CO2 25 20 - 29 mmol/L   Calcium 9.1 8.7 - 10.3 mg/dL   Total Protein 6.7 6.0 - 8.5 g/dL   Albumin 4.1 3.8 - 4.8 g/dL   Globulin, Total 2.6 1.5 - 4.5 g/dL   Albumin/Globulin Ratio 1.6 1.2 - 2.2   Bilirubin Total 0.2 0.0 - 1.2 mg/dL   Alkaline Phosphatase 81 44 - 121 IU/L   AST 17 0 - 40 IU/L   ALT 10 0 - 32 IU/L  Lipid Profile  Result Value Ref Range   Cholesterol, Total 169 100 - 199 mg/dL   Triglycerides 77 0 - 149 mg/dL   HDL 41 >29 mg/dL   VLDL Cholesterol Cal 15 5 - 40 mg/dL   LDL Chol Calc (NIH) 562 (H) 0 - 99 mg/dL   Chol/HDL Ratio 4.1 0.0 - 4.4 ratio   HgB A1c  Result Value Ref Range   Hgb A1c MFr Bld 6.2 (H) 4.8 - 5.6 %   Est. average glucose Bld gHb Est-mCnc 131 mg/dL      Assessment & Plan:   Problem List Items Addressed This Visit      Cardiovascular and Mediastinum   Essential hypertension     Endocrine   IFG (impaired fasting glucose)     Other   Morbid obesity (HCC)   Hyperlipidemia     Follow up plan: No follow-ups on file.

## 2022-09-24 ENCOUNTER — Other Ambulatory Visit: Payer: Medicare Other

## 2022-11-22 ENCOUNTER — Other Ambulatory Visit: Payer: Self-pay | Admitting: Nurse Practitioner

## 2022-11-24 NOTE — Telephone Encounter (Signed)
Requested Prescriptions  Pending Prescriptions Disp Refills   rosuvastatin (CRESTOR) 5 MG tablet [Pharmacy Med Name: Rosuvastatin Calcium 5 MG Oral Tablet] 15 tablet 0    Sig: Take 1 tablet by mouth once a week     Cardiovascular:  Antilipid - Statins 2 Failed - 11/22/2022  9:04 AM      Failed - Lipid Panel in normal range within the last 12 months    Cholesterol, Total  Date Value Ref Range Status  03/18/2022 169 100 - 199 mg/dL Final   Cholesterol Piccolo, Waived  Date Value Ref Range Status  01/05/2015 186 <200 mg/dL Final    Comment:                            Desirable                <200                         Borderline High      200- 239                         High                     >239    LDL Chol Calc (NIH)  Date Value Ref Range Status  03/18/2022 113 (H) 0 - 99 mg/dL Final   HDL  Date Value Ref Range Status  03/18/2022 41 >39 mg/dL Final   Triglycerides  Date Value Ref Range Status  03/18/2022 77 0 - 149 mg/dL Final   Triglycerides Piccolo,Waived  Date Value Ref Range Status  01/05/2015 73 <150 mg/dL Final    Comment:                            Normal                   <150                         Borderline High     150 - 199                         High                200 - 499                         Very High                >499          Passed - Cr in normal range and within 360 days    Creatinine, Ser  Date Value Ref Range Status  03/18/2022 0.82 0.57 - 1.00 mg/dL Final         Passed - Patient is not pregnant      Passed - Valid encounter within last 12 months    Recent Outpatient Visits           8 months ago BMI 45.0-49.9, adult [facility])   [facility] Larae Grooms, NP   1 year ago BMI 45.0-49.9, adult [facility])   [facility] Larae Grooms, NP   1 year ago BMI 45.0-49.9, adult (HCC)  [facility] Larae Grooms, NP   2 years ago Morbid obesity [facility])    [facility] Larae Grooms, NP   2 years ago Essential hypertension   [facility] Larae Grooms, NP

## 2022-12-10 ENCOUNTER — Telehealth: Payer: Medicare Other | Admitting: Physician Assistant

## 2022-12-10 ENCOUNTER — Ambulatory Visit: Payer: Self-pay

## 2022-12-10 DIAGNOSIS — U071 COVID-19: Secondary | ICD-10-CM | POA: Diagnosis not present

## 2022-12-10 MED ORDER — BENZONATATE 100 MG PO CAPS: 100.0000 mg | ORAL_CAPSULE | Freq: Three times a day (TID) | ORAL | 0 refills | Status: DC | PRN

## 2022-12-10 MED ORDER — NIRMATRELVIR/RITONAVIR (PAXLOVID)TABLET: 3.0000 | ORAL_TABLET | Freq: Two times a day (BID) | ORAL | 0 refills | Status: AC

## 2022-12-10 NOTE — Patient Instructions (Signed)
[name], thank you for joining Piedad Climes, PA-C for today's virtual visit.  While this provider is not your primary care provider (PCP), if your PCP is located in our provider database this encounter information will be shared with them immediately following your visit.   A [facility] MyChart account gives you access to today's visit and all your visits, tests, and labs performed at [facility] " click here if you don't have a [facility] MyChart account or go to [email or web address]  Consent: (Patient) Hannah Edwards provided verbal consent for this virtual visit at the beginning of the encounter.  Current Medications:  Current Outpatient Medications:    acetaminophen (TYLENOL) 500 MG tablet, Take 1,000 mg by mouth every 6 (six) hours as needed., Disp: , Rfl:    omeprazole (PRILOSEC) 20 MG capsule, Take 1 capsule (20 mg total) by mouth daily., Disp: 90 capsule, Rfl: 3   rosuvastatin (CRESTOR) 5 MG tablet, Take 1 tablet by mouth once a week, Disp: 15 tablet, Rfl: 0   Medications ordered in this encounter:  No orders of the defined types were placed in this encounter.    *If you need refills on other medications prior to your next appointment, please contact your pharmacy*  Follow-Up: Call back or seek an in-person evaluation if the symptoms worsen or if the condition fails to improve as anticipated.  [facility] [phone]  Care Instructions: Please keep well-hydrated and get plenty of rest. Start a saline nasal rinse to flush out your nasal passages. You can use plain Mucinex to help thin congestion. Make sure to hold off taking your Rosuvastatin until you have completed the Paxlovid. If you have a humidifier, running in the bedroom at night. I want you to start OTC vitamin D3 1000 units daily, vitamin C 1000 mg daily, and a zinc supplement. Please take prescribed medications as directed.    Isolation Instructions: You are to isolate  at home until you have been fever free for at least 24 hours without a fever-reducing medication, and symptoms have been steadily improving for 24 hours. At that time,  you can end isolation but need to mask for an additional 5 days.   If you must be around other household members who do not have symptoms, you need to make sure that both you and the family members are masking consistently with a high-quality mask.  If you note any worsening of symptoms despite treatment, please seek an in-person evaluation ASAP. If you note any significant shortness of breath or any chest pain, please seek ER evaluation. Please do not delay care!   COVID-19: What to Do if You Are Sick If you test positive and are an older adult or someone who is at high risk of getting very sick from COVID-19, treatment may be available. Contact a healthcare provider right away after a positive test to determine if you are eligible, even if your symptoms are mild right now. You can also visit a Test to Treat location and, if eligible, receive a prescription from a provider. Don't delay: Treatment must be started within the first few days to be effective. If you have a fever, cough, or other symptoms, you might have COVID-19. Most people have mild illness and are able to recover at home. If you are sick: Keep track of your symptoms. If you have an emergency warning sign (including trouble breathing), call 911. Steps to help prevent the spread of COVID-19 if you are sick If you  are sick with COVID-19 or think you might have COVID-19, follow the steps below to care for yourself and to help protect other people in your home and community. Stay home except to get medical care Stay home. Most people with COVID-19 have mild illness and can recover at home without medical care. Do not leave your home, except to get medical care. Do not visit public areas and do not go to places where you are unable to wear a mask. Take care of yourself. Get  rest and stay hydrated. Take over-the-counter medicines, such as acetaminophen, to help you feel better. Stay in touch with your doctor. Call before you get medical care. Be sure to get care if you have trouble breathing, or have any other emergency warning signs, or if you think it is an emergency. Avoid public transportation, ride-sharing, or taxis if possible. Get tested If you have symptoms of COVID-19, get tested. While waiting for test results, stay away from others, including staying apart from those living in your household. Get tested as soon as possible after your symptoms start. Treatments may be available for people with COVID-19 who are at risk for becoming very sick. Don't delay: Treatment must be started early to be effective--some treatments must begin within 5 days of your first symptoms. Contact your healthcare provider right away if your test result is positive to determine if you are eligible. Self-tests are one of several options for testing for the virus that causes COVID-19 and may be more convenient than laboratory-based tests and point-of-care tests. Ask your healthcare provider or your local health department if you need help interpreting your test results. You can visit your state, tribal, local, and territorial health department's website to look for the latest local information on testing sites. Separate yourself from other people As much as possible, stay in a specific room and away from other people and pets in your home. If possible, you should use a separate bathroom. If you need to be around other people or animals in or outside of the home, wear a well-fitting mask. Tell your close contacts that they may have been exposed to COVID-19. An infected person can spread COVID-19 starting 48 hours (or 2 days) before the person has any symptoms or tests positive. By letting your close contacts know they may have been exposed to COVID-19, you are helping to protect everyone. See  COVID-19 and Animals if you have questions about pets. If you are diagnosed with COVID-19, someone from the health department may call you. Answer the call to slow the spread. Monitor your symptoms Symptoms of COVID-19 include fever, cough, or other symptoms. Follow care instructions from your healthcare provider and local health department. Your local health authorities may give instructions on checking your symptoms and reporting information. When to seek emergency medical attention Look for emergency warning signs* for COVID-19. If someone is showing any of these signs, seek emergency medical care immediately: Trouble breathing Persistent pain or pressure in the chest New confusion Inability to wake or stay awake Pale, gray, or blue-colored skin, lips, or nail beds, depending on skin tone *This list is not all possible symptoms. Please call your medical provider for any other symptoms that are severe or concerning to you. Call 911 or call ahead to your local emergency facility: Notify the operator that you are seeking care for someone who has or may have COVID-19. Call ahead before visiting your doctor Call ahead. Many medical visits for routine care are being postponed or  done by phone or telemedicine. If you have a medical appointment that cannot be postponed, call your doctor's office, and tell them you have or may have COVID-19. This will help the office protect themselves and other patients. If you are sick, wear a well-fitting mask You should wear a mask if you must be around other people or animals, including pets (even at home). Wear a mask with the best fit, protection, and comfort for you. You don't need to wear the mask if you are alone. If you can't put on a mask (because of trouble breathing, for example), cover your coughs and sneezes in some other way. Try to stay at least 6 feet away from other people. This will help protect the people around you. Masks should not be placed on  young children under age 87 years, anyone who has trouble breathing, or anyone who is not able to remove the mask without help. Cover your coughs and sneezes Cover your mouth and nose with a tissue when you cough or sneeze. Throw away used tissues in a lined trash can. Immediately wash your hands with soap and water for at least 20 seconds. If soap and water are not available, clean your hands with an alcohol-based hand sanitizer that contains at least 60% alcohol. Clean your hands often Wash your hands often with soap and water for at least 20 seconds. This is especially important after blowing your nose, coughing, or sneezing; going to the bathroom; and before eating or preparing food. Use hand sanitizer if soap and water are not available. Use an alcohol-based hand sanitizer with at least 60% alcohol, covering all surfaces of your hands and rubbing them together until they feel dry. Soap and water are the best option, especially if hands are visibly dirty. Avoid touching your eyes, nose, and mouth with unwashed hands. Handwashing Tips Avoid sharing personal household items Do not share dishes, drinking glasses, cups, eating utensils, towels, or bedding with other people in your home. Wash these items thoroughly after using them with soap and water or put in the dishwasher. Clean surfaces in your home regularly Clean and disinfect high-touch surfaces (for example, doorknobs, tables, handles, light switches, and countertops) in your "sick room" and bathroom. In shared spaces, you should clean and disinfect surfaces and items after each use by the person who is ill. If you are sick and cannot clean, a caregiver or other person should only clean and disinfect the area around you (such as your bedroom and bathroom) on an as needed basis. Your caregiver/other person should wait as long as possible (at least several hours) and wear a mask before entering, cleaning, and disinfecting shared spaces that you  use. Clean and disinfect areas that may have blood, stool, or body fluids on them. Use household cleaners and disinfectants. Clean visible dirty surfaces with household cleaners containing soap or detergent. Then, use a household disinfectant. Use a product from [employer] List N: Disinfectants for Coronavirus (COVID-19). Be sure to follow the instructions on the label to ensure safe and effective use of the product. Many products recommend keeping the surface wet with a disinfectant for a certain period of time (look at "contact time" on the product label). You may also need to wear personal protective equipment, such as gloves, depending on the directions on the product label. Immediately after disinfecting, wash your hands with soap and water for 20 seconds. For completed guidance on cleaning and disinfecting your home, visit Complete Disinfection Guidance. Take steps to improve  ventilation at home Improve ventilation (air flow) at home to help prevent from spreading COVID-19 to other people in your household. Clear out COVID-19 virus particles in the air by opening windows, using air filters, and turning on fans in your home. Use this interactive tool to learn how to improve air flow in your home. When you can be around others after being sick with COVID-19 Deciding when you can be around others is different for different situations. Find out when you can safely end home isolation. For any additional questions about your care, contact your healthcare provider or state or local health department. 07/24/2020 Content source: [facility] for Immunization and Respiratory Diseases ([facility]), Division of Viral Diseases This information is not intended to replace advice given to you by your health care provider. Make sure you discuss any questions you have with your health care provider. Document Revised: 09/06/2020 Document Reviewed: 09/06/2020 Elsevier Patient Education  2022 [employer].   If you  have been instructed to have an in-person evaluation today at a local Urgent Care facility, please use the link below. It will take you to a list of all of our available [facility], including address, phone number and hours of operation. Please do not delay care.  [facility]  If you or a family member do not have a primary care provider, use the link below to schedule a visit and establish care. When you choose a [facility] primary care physician or advanced practice provider, you gain a long-term partner in health. Find a Primary Care Provider  Learn more about [facility]'s in-office and virtual care options: [facility] - Get Care Now

## 2022-12-10 NOTE — Progress Notes (Signed)
Virtual Visit Consent   Hannah Edwards, you are scheduled for a virtual visit with a [facility] provider today. Just as with appointments in the office, your consent must be obtained to participate. Your consent will be active for this visit and any virtual visit you may have with one of our providers in the next 365 days. If you have a MyChart account, a copy of this consent can be sent to you electronically.  As this is a virtual visit, video technology does not allow for your provider to perform a traditional examination. This may limit your provider's ability to fully assess your condition. If your provider identifies any concerns that need to be evaluated in person or the need to arrange testing (such as labs, EKG, etc.), we will make arrangements to do so. Although advances in technology are sophisticated, we cannot ensure that it will always work on either your end or our end. If the connection with a video visit is poor, the visit may have to be switched to a telephone visit. With either a video or telephone visit, we are not always able to ensure that we have a secure connection.  By engaging in this virtual visit, you consent to the provision of healthcare and authorize for your insurance to be billed (if applicable) for the services provided during this visit. Depending on your insurance coverage, you may receive a charge related to this service.  I need to obtain your verbal consent now. Are you willing to proceed with your visit today? Hannah Edwards has provided verbal consent on 12/10/2022 for a virtual visit (video or telephone). Piedad Climes, [place]  Date: 12/10/2022 3:03 PM  Virtual Visit via Video Note   I, Piedad Climes, connected with  Hannah Edwards  ([id number], [birth year]) on 12/10/22 at  3:00 PM EDT by a video-enabled telemedicine application and verified that I am speaking with the correct person using two identifiers.  Location: Patient: Virtual Visit Location Patient:  Home Provider: Virtual Visit Location Provider: Home Office   I discussed the limitations of evaluation and management by telemedicine and the availability of in person appointments. The patient expressed understanding and agreed to proceed.    History of Present Illness: Hannah Edwards is a 72 y.o. who identifies as a female who was assigned female at birth, and is being seen today for COVID-19. Symptoms yesterday with headache, fatigue, sore throat, nasal congestion. Today with less headache but still fatigue. Unsure of fever. Denies chest pain or SOB. Denies GI symptoms. Took COVID test today that was positive. Has previously been hospitalized for COVID so cautious.   HPI: HPI  Problems:  Patient Active Problem List   Diagnosis Date Noted   BMI 45.0-49.9, adult (HCC) 05/17/2018   Advanced care planning/counseling discussion 12/10/2016   IFG (impaired fasting glucose) 05/11/2015   GERD (gastroesophageal reflux disease) 01/04/2015   Essential hypertension 01/04/2015   Morbid obesity (HCC) 01/04/2015   Osteopenia 01/04/2015   Hyperlipidemia 01/04/2015   Allergic rhinitis 01/04/2015    Allergies:  Allergies  Allergen Reactions   Ace Inhibitors Anaphylaxis   Lisinopril Anaphylaxis   Atorvastatin Other (See Comments)    Body aches   Medications:  Current Outpatient Medications:    benzonatate (TESSALON) 100 MG capsule, Take 1 capsule (100 mg total) by mouth 3 (three) times daily as needed for cough., Disp: 30 capsule, Rfl: 0   nirmatrelvir/ritonavir (PAXLOVID) 20 x 150 MG & 10 x 100MG  TABS, Take 3 tablets by mouth 2 (two)  times daily for 5 days. (Take nirmatrelvir 150 mg two tablets twice daily for 5 days and ritonavir 100 mg one tablet twice daily for 5 days) Patient GFR is 76, Disp: 30 tablet, Rfl: 0   acetaminophen (TYLENOL) 500 MG tablet, Take 1,000 mg by mouth every 6 (six) hours as needed., Disp: , Rfl:    omeprazole (PRILOSEC) 20 MG capsule, Take 1 capsule (20 mg total) by mouth  daily., Disp: 90 capsule, Rfl: 3   rosuvastatin (CRESTOR) 5 MG tablet, Take 1 tablet by mouth once a week, Disp: 15 tablet, Rfl: 0  Observations/Objective: Patient is well-developed, well-nourished in no acute distress.  Resting comfortably at home.  Head is normocephalic, atraumatic.  No labored breathing. Speech is clear and coherent with logical content.  Patient is alert and oriented at baseline.   Assessment and Plan: 1. COVID-19 - benzonatate (TESSALON) 100 MG capsule; Take 1 capsule (100 mg total) by mouth 3 (three) times daily as needed for cough.  Dispense: 30 capsule; Refill: 0 - nirmatrelvir/ritonavir (PAXLOVID) 20 x 150 MG & 10 x 100MG  TABS; Take 3 tablets by mouth 2 (two) times daily for 5 days. (Take nirmatrelvir 150 mg two tablets twice daily for 5 days and ritonavir 100 mg one tablet twice daily for 5 days) Patient GFR is 76  Dispense: 30 tablet; Refill: 0  Patient with multiple risk factors for complicated course of illness. Discussed risks/benefits of antiviral medications including most common potential ADRs. Patient voiced understanding and would like to proceed with antiviral medication. They are candidate for Paxlovid (GFR 76). She is to hold Rosuvastatin while on antiviral. Rx sent to pharmacy. Supportive measures, OTC medications and vitamin regimen reviewed. Tessalon per orders. Quarantine reviewed in detail. Strict ER precautions discussed with patient.   Follow Up Instructions: I discussed the assessment and treatment plan with the patient. The patient was provided an opportunity to ask questions and all were answered. The patient agreed with the plan and demonstrated an understanding of the instructions.  A copy of instructions were sent to the patient via MyChart unless otherwise noted below.   The patient was advised to call back or seek an in-person evaluation if the symptoms worsen or if the condition fails to improve as anticipated.  Time:  I spent 10 minutes  with the patient via telehealth technology discussing the above problems/concerns.    Piedad Climes, PA-C

## 2022-12-10 NOTE — Telephone Encounter (Signed)
Pt has tested positive for covid, has symptoms; sore throat, cough, feels yucky.  Seeking Rx for treatment   Chief Complaint: COVID positive today. Cough, chills, fatigue, sore throat. Symptoms: Above Frequency: Yesterday Pertinent Negatives: Patient denies  Disposition: [] ED /[] Urgent Care (no appt availability in office) / [] Appointment(In office/virtual)/ [x]  [place] [facility]/ [] Home Care/ [] Refused Recommended Disposition /[] [place] [facility]/ []  Follow-up with PCP Additional Notes: Pt. Agrees with VV today.  Reason for Disposition  MILD difficulty breathing (e.g., minimal/no SOB at rest, SOB with walking, pulse <100)  Answer Assessment - Initial Assessment Questions 1. COVID-19 DIAGNOSIS: "How do you know that you have COVID?" (e.g., positive lab test or self-test, diagnosed by doctor or NP/PA, symptoms after exposure).     Work 2. COVID-19 EXPOSURE: "Was there any known exposure to COVID before the symptoms began?" CDC Definition of close contact: within 6 feet (2 meters) for a total of 15 minutes or more over a 24-hour period.      Yes 3. ONSET: "When did the COVID-19 symptoms start?"      Yesterday 4. WORST SYMPTOM: "What is your worst symptom?" (e.g., cough, fever, shortness of breath, muscle aches)     Sore throat, chills, cough 5. COUGH: "Do you have a cough?" If Yes, ask: "How bad is the cough?"       Yes 6. FEVER: "Do you have a fever?" If Yes, ask: "What is your temperature, how was it measured, and when did it start?"     Unsure 7. RESPIRATORY STATUS: "Describe your breathing?" (e.g., normal; shortness of breath, wheezing, unable to speak)      No 8. BETTER-SAME-WORSE: "Are you getting better, staying the same or getting worse compared to yesterday?"  If getting worse, ask, "In what way?"     Better 9. OTHER SYMPTOMS: "Do you have any other symptoms?"  (e.g., chills, fatigue, headache, loss of smell or taste, muscle pain, sore throat)     Fatigue 10. HIGH  RISK DISEASE: "Do you have any chronic medical problems?" (e.g., asthma, heart or lung disease, weak immune system, obesity, etc.)       No 11. VACCINE: "Have you had the COVID-19 vaccine?" If Yes, ask: "Which one, how many shots, when did you get it?"       N/a 12. PREGNANCY: "Is there any chance you are pregnant?" "When was your last menstrual period?"       No 13. O2 SATURATION MONITOR:  "Do you use an oxygen saturation monitor (pulse oximeter) at home?" If Yes, ask "What is your reading (oxygen level) today?" "What is your usual oxygen saturation reading?" (e.g., 95%)       No  Protocols used: Coronavirus (COVID-19) Diagnosed or Suspected-A-AH

## 2022-12-16 ENCOUNTER — Telehealth (INDEPENDENT_AMBULATORY_CARE_PROVIDER_SITE_OTHER): Payer: Medicare Other | Admitting: Physician Assistant

## 2022-12-16 ENCOUNTER — Encounter: Payer: Self-pay | Admitting: Physician Assistant

## 2022-12-16 DIAGNOSIS — U071 COVID-19: Secondary | ICD-10-CM | POA: Diagnosis not present

## 2022-12-16 NOTE — Progress Notes (Signed)
Virtual Visit via Video Note  I connected with Hannah Edwards on 12/16/22 at  3:40 PM EDT by a video enabled telemedicine application and verified that I am speaking with the correct person using two identifiers.  Today's Provider: Jacquelin Hawking, MHS, PA-C Introduced myself to the patient as a PA-C and provided education on APPs in clinical practice.    Location: Patient: At home  Provider: [facility], [place], [place]    I discussed the limitations of evaluation and management by telemedicine and the availability of in person appointments. The patient expressed understanding and agreed to proceed.  Chief Complaint  Patient presents with   Covid Positive    Patient says she recently tested positive and completed course of treatment of Paxlovid antiviral. Patient says she is still having lingering symptoms of feeling tired and coughing up clear phlegm. Patient says she has been taking over the counter NyQuil and vitamins.     History of Present Illness:  COVID positive  Symptoms started on 12/09/22 and she was seen via virtual visit on 12/10/22  She was prescribed tessalon and Paxlovid  She has finished her Paxlovid at this time  She states she is still having fatigue and productive coughing She reports she did not go back to work yesterday due to fatigue  She reports wheezing this AM and has some dizziness with standing but this is intermittent   Interventions: Nyquil at night and tessalon pearls during the day   She has checked her O2 and it was 99% today   She is requesting work note for an additional week due to symptoms We discussed CDC recommendations for quarantine and masking following COVID infection- I informed her that I cannot write a note past the initial 5 day quarantine unless it is for today's apt only  She states she "will handle it myself, I won't put myself at further risk"    Review of Systems  Constitutional:  Positive for malaise/fatigue. Negative for  chills and fever.  HENT:  Positive for congestion. Negative for sore throat.   Respiratory:  Positive for cough and wheezing. Negative for shortness of breath.   Cardiovascular:  Negative for chest pain and leg swelling.  Gastrointestinal:  Negative for diarrhea, nausea and vomiting.  Neurological:  Positive for dizziness.      Observations/Objective:  Due to the nature of the virtual visit, physical exam and observations are limited. Able to obtain the following observations:   Alert, oriented, x 3 Appears comfortable, in no acute distress.  No scleral injection, no appreciated hoarseness, tachypnea, wheeze or strider. Able to maintain conversation without visible strain.  No cough appreciated during visit.    Assessment and Plan:  Problem List Items Addressed This Visit   None Visit Diagnoses     COVID-19    -  Primary Acute, ongoing concern Patient tested positive on 12/10/22 and was seen virtually for COVID infection. She has completed her Paxlovid course but is still having fatigue and productive coughing Reviewed that this is to be expected with COVID recovery and recommended OTC Dayquil or similar medication in addition to Tessalon pearls to assist with daytime symptoms We reviewed CDC recommendations regarding quarantine and masking after infection Reviewed ED and return precautions. Follow up as needed for persistent or progressing symptoms         Follow Up Instructions:    I discussed the assessment and treatment plan with the patient. The patient was provided an opportunity to ask questions and  all were answered. The patient agreed with the plan and demonstrated an understanding of the instructions.   The patient was advised to call back or seek an in-person evaluation if the symptoms worsen or if the condition fails to improve as anticipated.  I provided 10 minutes of non-face-to-face time during this encounter.  No follow-ups on file.   I,  E ,  PA-C, have reviewed all documentation for this visit. The documentation on 12/16/22 for the exam, diagnosis, procedures, and orders are all accurate and complete.   Jacquelin Hawking, MHS, PA-C [facility]

## 2023-02-16 ENCOUNTER — Other Ambulatory Visit: Payer: Self-pay | Admitting: Nurse Practitioner

## 2023-02-17 NOTE — Telephone Encounter (Signed)
Labs in date.  Only takes once a week.  Requested Prescriptions  Pending Prescriptions Disp Refills   rosuvastatin (CRESTOR) 5 MG tablet [Pharmacy Med Name: Rosuvastatin Calcium 5 MG Oral Tablet] 15 tablet 0    Sig: Take 1 tablet by mouth once a week     Cardiovascular:  Antilipid - Statins 2 Failed - 02/16/2023 10:28 AM      Failed - Lipid Panel in normal range within the last 12 months    Cholesterol, Total  Date Value Ref Range Status  03/18/2022 169 100 - 199 mg/dL Final   Cholesterol Piccolo, Waived  Date Value Ref Range Status  01/05/2015 186 <200 mg/dL Final    Comment:                            Desirable                <200                         Borderline High      200- 239                         High                     >239    LDL Chol Calc (NIH)  Date Value Ref Range Status  03/18/2022 113 (H) 0 - 99 mg/dL Final   HDL  Date Value Ref Range Status  03/18/2022 41 >39 mg/dL Final   Triglycerides  Date Value Ref Range Status  03/18/2022 77 0 - 149 mg/dL Final   Triglycerides Piccolo,Waived  Date Value Ref Range Status  01/05/2015 73 <150 mg/dL Final    Comment:                            Normal                   <150                         Borderline High     150 - 199                         High                200 - 499                         Very High                >499          Passed - Cr in normal range and within 360 days    Creatinine, Ser  Date Value Ref Range Status  03/18/2022 0.82 0.57 - 1.00 mg/dL Final         Passed - Patient is not pregnant      Passed - Valid encounter within last 12 months    Recent Outpatient Visits           2 months ago COVID-19   [place] [facility] Mecum, Erin E, PA-C   11 months ago BMI 45.0-49.9, adult [facility])    [facility] Larae Grooms, NP   1  year ago BMI 45.0-49.9, adult [facility])   [facility] Larae Grooms, NP   1 year ago  BMI 45.0-49.9, adult [facility])   [facility] Larae Grooms, NP   2 years ago Morbid obesity [facility])   [place] [facility] Larae Grooms, NP

## 2023-05-17 ENCOUNTER — Other Ambulatory Visit: Payer: Self-pay | Admitting: Nurse Practitioner

## 2023-05-19 NOTE — Telephone Encounter (Signed)
 Requested medications are due for refill today.  yes  Requested medications are on the active medications list.  yes  Last refill. 02/17/2023 #15 0 rf  Future visit scheduled.   yes  Notes to clinic.  Labs are expired.     Requested Prescriptions  Pending Prescriptions Disp Refills   rosuvastatin  (CRESTOR ) 5 MG tablet [Pharmacy Med Name: Rosuvastatin  Calcium  5 MG Oral Tablet] 15 tablet 0    Sig: Take 1 tablet by mouth once a week     Cardiovascular:  Antilipid - Statins 2 Failed - 05/19/2023 12:03 PM      Failed - Cr in normal range and within 360 days    Creatinine, Ser  Date Value Ref Range Status  03/18/2022 0.82 0.57 - 1.00 mg/dL Final         Failed - Lipid Panel in normal range within the last 12 months    Cholesterol, Total  Date Value Ref Range Status  03/18/2022 169 100 - 199 mg/dL Final   Cholesterol Piccolo, Waived  Date Value Ref Range Status  01/05/2015 186 <200 mg/dL Final    Comment:                            Desirable                <200                         Borderline High      200- 239                         High                     >239    LDL Chol Calc (NIH)  Date Value Ref Range Status  03/18/2022 113 (H) 0 - 99 mg/dL Final   HDL  Date Value Ref Range Status  03/18/2022 41 >39 mg/dL Final   Triglycerides  Date Value Ref Range Status  03/18/2022 77 0 - 149 mg/dL Final   Triglycerides Piccolo,Waived  Date Value Ref Range Status  01/05/2015 73 <150 mg/dL Final    Comment:                            Normal                   <150                         Borderline High     150 - 199                         High                200 - 499                         Very High                >499          Passed - Patient is not pregnant      Passed - Valid encounter within last 12 months    Recent Outpatient Visits           5  months ago COVID-19   [place] [facility] Mecum, Rocky BRAVO, PA-C   1 year ago BMI 45.0-49.9,  adult [facility])   [place] [facility] Melvin Pao, NP   1 year ago BMI 45.0-49.9, adult [facility])   [place] [facility] Melvin Pao, NP   2 years ago BMI 45.0-49.9, adult [facility])   [place] [facility] Melvin Pao, NP   2 years ago Morbid obesity [facility])   [place] [facility] Melvin Pao, NP

## 2023-06-09 ENCOUNTER — Encounter: Payer: Self-pay | Admitting: Pediatrics

## 2023-06-09 ENCOUNTER — Ambulatory Visit (INDEPENDENT_AMBULATORY_CARE_PROVIDER_SITE_OTHER): Payer: Medicare Other | Admitting: Pediatrics

## 2023-06-09 VITALS — BP 149/81 | HR 73 | Temp 98.6°F | Resp 16 | Ht 64.02 in | Wt 259.6 lb

## 2023-06-09 DIAGNOSIS — Z13220 Encounter for screening for lipoid disorders: Secondary | ICD-10-CM | POA: Diagnosis not present

## 2023-06-09 DIAGNOSIS — G8929 Other chronic pain: Secondary | ICD-10-CM | POA: Diagnosis not present

## 2023-06-09 DIAGNOSIS — R0982 Postnasal drip: Secondary | ICD-10-CM | POA: Diagnosis not present

## 2023-06-09 DIAGNOSIS — Z131 Encounter for screening for diabetes mellitus: Secondary | ICD-10-CM

## 2023-06-09 DIAGNOSIS — R0602 Shortness of breath: Secondary | ICD-10-CM

## 2023-06-09 DIAGNOSIS — R030 Elevated blood-pressure reading, without diagnosis of hypertension: Secondary | ICD-10-CM | POA: Diagnosis not present

## 2023-06-09 DIAGNOSIS — Z1330 Encounter for screening examination for mental health and behavioral disorders, unspecified: Secondary | ICD-10-CM

## 2023-06-09 DIAGNOSIS — R5383 Other fatigue: Secondary | ICD-10-CM | POA: Diagnosis not present

## 2023-06-09 DIAGNOSIS — M545 Low back pain, unspecified: Secondary | ICD-10-CM | POA: Diagnosis not present

## 2023-06-09 DIAGNOSIS — I2089 Other forms of angina pectoris: Secondary | ICD-10-CM | POA: Diagnosis not present

## 2023-06-09 NOTE — Progress Notes (Signed)
 Office Visit  BP (!) 149/81 (BP Location: Left Arm, Patient Position: Sitting, Cuff Size: Large)   Pulse 73   Temp 98.6 F (37 C) (Oral)   Resp 16   Ht 5' 4.02 (1.626 m)   Wt 259 lb 9.6 oz (117.8 kg)   LMP  (LMP Unknown)   SpO2 97%   BMI 44.54 kg/m    Subjective:    Patient ID: Hannah Edwards, female    DOB: Dec 22, 1950, 73 y.o.   MRN: [id number]  HPI: Hannah Edwards is a 73 y.o. female  Chief Complaint  Patient presents with   URI    Started 2 weeks with congestion and some wheezing. Out of work last week.    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 Mid thoracic and stayed out of work some.     Discussed the use of AI scribe software for clinical note transcription with the patient, who gave verbal consent to proceed.  History of Present Illness   Hannah Edwards is a 73 year old female who presents with chest pain and fatigue.  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 The pain initially started when she began the job and worsened with continued physical activity. She describes the pain as feeling like 'somebody's sitting on you' and notes it is located in the chest area. The pain improves with rest, as evidenced by a week of rest at home which alleviated the symptoms, but it recurs with physical exertion. No reproducibility by touch, and she describes it as more internal.  She reports significant fatigue, stating she feels 'totally tired' and can fall asleep easily during the day. Her sleep pattern has been disrupted, with difficulty falling asleep until 3 or 4 AM and waking around 11 AM. Despite this, she feels rested upon waking. She attributes her fatigue to stress and physical exertion from her job.  She mentions experiencing congestion intermittently, which she associates with a change in her voice, describing it as sounding 'like a man'. No sinus pressure but  notes a runny nose. She has had COVID-19 twice in the past and works in an environment where others have had pneumonia and viral infections.  She reports a change in her eating habits, leading to unintentional weight loss from 270 pounds to 150 pounds. She attributes this to a decreased appetite and states she sometimes has to make herself eat.  She uses Tylenol  Extra Strength for pain relief, particularly for back pain associated with her job's physical demands. She denies smoking and has no history of COPD.  Her blood pressure was noted to be higher than usual, but she is not currently on any antihypertensive medication.   Wt Readings from Last 3 Encounters:  06/09/23 259 lb 9.6 oz (117.8 kg)  07/21/22 270 lb (122.5 kg)  03/18/22 270 lb 11.2 oz (122.8 kg)   Relevant past medical, surgical, family and social history reviewed and updated as indicated. Interim medical history since our last visit reviewed. Allergies and medications reviewed and updated.  ROS per HPI unless specifically indicated above     Objective:    BP (!) 149/81 (BP Location: Left Arm, Patient Position: Sitting, Cuff Size: Large)   Pulse 73   Temp 98.6 F (37 C) (Oral)   Resp 16   Ht 5' 4.02 (1.626 m)   Wt 259 lb 9.6 oz (117.8 kg)  LMP  (LMP Unknown)   SpO2 97%   BMI 44.54 kg/m   Wt Readings from Last 3 Encounters:  06/09/23 259 lb 9.6 oz (117.8 kg)  07/21/22 270 lb (122.5 kg)  03/18/22 270 lb 11.2 oz (122.8 kg)     Physical Exam Constitutional:      Appearance: Normal appearance.  HENT:     Head: Normocephalic and atraumatic.  Eyes:     Pupils: Pupils are equal, round, and reactive to light.  Cardiovascular:     Rate and Rhythm: Normal rate and regular rhythm.     Pulses: Normal pulses.     Heart sounds: Normal heart sounds.  Pulmonary:     Effort: Pulmonary effort is normal.     Breath sounds: Normal breath sounds.  Abdominal:     General: Abdomen is flat.     Palpations: Abdomen is soft.   Musculoskeletal:        General: Normal range of motion.     Cervical back: Normal range of motion.  Skin:    General: Skin is warm and dry.     Capillary Refill: Capillary refill takes less than 2 seconds.  Neurological:     General: No focal deficit present.     Mental Status: She is alert. Mental status is at baseline.  Psychiatric:        Mood and Affect: Mood normal.        Behavior: Behavior normal.         06/09/2023    1:27 PM 07/21/2022   10:30 AM 03/18/2022   11:03 AM 09/11/2021   11:01 AM 07/11/2021    1:10 PM  Depression screen PHQ 2/9  Decreased Interest 3 0 0 0 0  Down, Depressed, Hopeless 0 0 0 0 0  PHQ - 2 Score 3 0 0 0 0  Altered sleeping 0 0 0 0   Tired, decreased energy 3 0 0 0   Change in appetite 2 0 0 0   Feeling bad or failure about yourself  0 0 0 0   Trouble concentrating 0 0 0 0   Moving slowly or fidgety/restless 0 0 0 0   Suicidal thoughts  0 0 0   PHQ-9 Score 8 0 0 0   Difficult doing work/chores Somewhat difficult Not difficult at all Not difficult at all Not difficult at all        06/09/2023    1:27 PM 03/18/2022   11:03 AM 09/11/2021   11:02 AM 03/13/2021   10:56 AM  GAD 7 : Generalized Anxiety Score  Nervous, Anxious, on Edge 0 0 0 0  Control/stop worrying 0 0 0 0  Worry too much - different things 0 0 0 0  Trouble relaxing  0 0 0  Restless 0 0 0 0  Easily annoyed or irritable 1 0 0 0  Afraid - awful might happen 0 0 0 0  Total GAD 7 Score  0 0 0  Anxiety Difficulty Somewhat difficult Not difficult at all Not difficult at all        Assessment & Plan:  Assessment & Plan   Stable angina (HCC) SOB (shortness of breath) Exertional chest pain, relieved by rest. No associated dyspnea, diaphoresis, or radiation. No tenderness on palpation. EKG not performed today due to absence of symptoms. Unclear if MSK in nature or if cardiac etiology. Of note, main risk factor for clot is her BMI otherwise low suspcicion for PE as cause of symptoms.  Pt given strict return precautions and anticipatory guidance for ED if new or worsening symptoms. -Order stress test to rule out cardiac etiology.   -Advise patient to avoid heavy lifting.   -Provide work note indicating no heavy lifting.   -     Ambulatory referral to Cardiology -     Brain natriuretic peptide -     D-dimer, quantitative -     Brain natriuretic peptide  Other fatigue Reports feeling extremely tired and fatigued, with changes in sleep pattern. Unintentional weight loss reported. Unclear etiology but will start with blood work may be related to above. Consider epwroth and STOPBANG scoring next visit if unremarkable work up. -Order comprehensive metabolic panel, complete blood count, and thyroid  function tests to rule out metabolic or endocrine causes.   -     CBC with Differential/Platelet -     Thyroid  Panel With TSH  Elevated blood pressure reading Elevated blood pressure noted during visit. No current antihypertensive medication. She does have HTN in her problem list, but did not recall. Measure at home and return in 2 weeks. -Advise patient to monitor blood pressure at home.   -Schedule follow-up appointment in 2 weeks to reassess blood pressure.   -     Comprehensive metabolic panel  Encounter for behavioral health screening As part of their intake evaluation, the patient was screened for depression, anxiety.  PHQ9 SCORE 8, GAD7 SCORE 0. Screening results positive for depression but denied upon further discussion. CTM.  Diabetes mellitus screening -     Hemoglobin A1c  Lipid screening -     Lipid panel  Post-nasal drip Reports congestion and voice changes. No sinus pressure or significant cough. Exposure to individuals with pneumonia and viral illness reported.   -Prescribe Flonase nasal spray for suspected postnasal drip.   -Advise patient to monitor symptoms and return if she worsens or persists.    Back Pain   Reports back pain associated with heavy lifting  at work.   -Recommend over-the-counter diclofenac gel (Voltaren) for topical pain relief.    Follow up plan: Return in about 2 weeks (around 06/23/2023) for HTN, back pain.  Eline Geng SHAUNNA NETT, MD

## 2023-06-09 NOTE — Patient Instructions (Addendum)
 Plan for today:  For your chest discomfort, it may be muscular but we need to make sure it is not your heart. I am referring you to get a stress test with cardiology.  Please continue checking your blood pressure at home. If above 140/90 may need to consider restarting blood pressure medications.  For your back pain: Acetaminophen  (Tylenol ): 1000mg  (extra strength tablets are 500mg , so take 2) every 8 hours if needed  Pick up some voltaren gel to use as needed I recommend no heavy lifting at work to avoid back issues I've attached some exercises to help as well  I sent flonase for post nasal drip, take this spray daily  I will message you after I get lab results back.

## 2023-06-10 ENCOUNTER — Other Ambulatory Visit: Payer: Self-pay | Admitting: Pediatrics

## 2023-06-10 DIAGNOSIS — E7800 Pure hypercholesterolemia, unspecified: Secondary | ICD-10-CM

## 2023-06-10 LAB — COMPREHENSIVE METABOLIC PANEL
ALT: 11 [IU]/L (ref 0–32)
AST: 15 [IU]/L (ref 0–40)
Albumin: 4 g/dL (ref 3.8–4.8)
Alkaline Phosphatase: 86 [IU]/L (ref 44–121)
BUN/Creatinine Ratio: 13 (ref 12–28)
BUN: 11 mg/dL (ref 8–27)
Bilirubin Total: 0.5 mg/dL (ref 0.0–1.2)
CO2: 25 mmol/L (ref 20–29)
Calcium: 9.6 mg/dL (ref 8.7–10.3)
Chloride: 104 mmol/L (ref 96–106)
Creatinine, Ser: 0.86 mg/dL (ref 0.57–1.00)
Globulin, Total: 2.7 g/dL (ref 1.5–4.5)
Glucose: 95 mg/dL (ref 70–99)
Potassium: 4.2 mmol/L (ref 3.5–5.2)
Sodium: 145 mmol/L — ABNORMAL HIGH (ref 134–144)
Total Protein: 6.7 g/dL (ref 6.0–8.5)
eGFR: 71 mL/min/{1.73_m2} (ref >59)

## 2023-06-10 LAB — LIPID PANEL
Chol/HDL Ratio: 3.7 {ratio} (ref 0.0–4.4)
Cholesterol, Total: 226 mg/dL — ABNORMAL HIGH (ref 100–199)
HDL: 61 mg/dL (ref >39)
LDL Chol Calc (NIH): 153 mg/dL — ABNORMAL HIGH (ref 0–99)
Triglycerides: 69 mg/dL (ref 0–149)
VLDL Cholesterol Cal: 12 mg/dL (ref 5–40)

## 2023-06-10 LAB — CBC WITH DIFFERENTIAL/PLATELET
Basophils Absolute: 0 10*3/uL (ref 0.0–0.2)
Basos: 1 %
EOS (ABSOLUTE): 0.2 10*3/uL (ref 0.0–0.4)
Eos: 3 %
Hematocrit: 41.4 % (ref 34.0–46.6)
Hemoglobin: 13.6 g/dL (ref 11.1–15.9)
Immature Grans (Abs): 0 10*3/uL (ref 0.0–0.1)
Immature Granulocytes: 0 %
Lymphocytes Absolute: 2.2 10*3/uL (ref 0.7–3.1)
Lymphs: 37 %
MCH: 30.4 pg (ref 26.6–33.0)
MCHC: 32.9 g/dL (ref 31.5–35.7)
MCV: 92 fL (ref 79–97)
Monocytes Absolute: 0.4 10*3/uL (ref 0.1–0.9)
Monocytes: 7 %
Neutrophils Absolute: 3.1 10*3/uL (ref 1.4–7.0)
Neutrophils: 52 %
Platelets: 305 10*3/uL (ref 150–450)
RBC: 4.48 x10E6/uL (ref 3.77–5.28)
RDW: 12.8 % (ref 11.7–15.4)
WBC: 5.9 10*3/uL (ref 3.4–10.8)

## 2023-06-10 LAB — THYROID PANEL WITH TSH
Free Thyroxine Index: 2.7 (ref 1.2–4.9)
T3 Uptake Ratio: 29 % (ref 24–39)
T4, Total: 9.3 ug/dL (ref 4.5–12.0)
TSH: 3.27 u[IU]/mL (ref 0.450–4.500)

## 2023-06-10 LAB — BRAIN NATRIURETIC PEPTIDE: BNP: 46.6 pg/mL (ref 0.0–100.0)

## 2023-06-10 LAB — HEMOGLOBIN A1C
Est. average glucose Bld gHb Est-mCnc: 128 mg/dL
Hgb A1c MFr Bld: 6.1 % — ABNORMAL HIGH (ref 4.8–5.6)

## 2023-06-10 LAB — D-DIMER, QUANTITATIVE: D-DIMER: 0.52 mg{FEU}/L — ABNORMAL HIGH (ref 0.00–0.49)

## 2023-06-10 MED ORDER — ROSUVASTATIN CALCIUM 10 MG PO TABS: 10.0000 mg | ORAL_TABLET | ORAL | 2 refills | Status: AC

## 2023-06-23 ENCOUNTER — Ambulatory Visit: Payer: Medicare Other | Admitting: Nurse Practitioner

## 2023-06-23 ENCOUNTER — Encounter: Payer: Self-pay | Admitting: Nurse Practitioner

## 2023-06-23 VITALS — BP 164/97 | HR 76 | Ht 65.0 in | Wt 258.0 lb

## 2023-06-23 DIAGNOSIS — I10 Essential (primary) hypertension: Secondary | ICD-10-CM | POA: Diagnosis not present

## 2023-06-23 DIAGNOSIS — R5383 Other fatigue: Secondary | ICD-10-CM | POA: Diagnosis not present

## 2023-06-23 DIAGNOSIS — Z23 Encounter for immunization: Secondary | ICD-10-CM | POA: Diagnosis not present

## 2023-06-23 MED ORDER — AMLODIPINE BESYLATE 5 MG PO TABS
5.0000 mg | ORAL_TABLET | Freq: Every day | ORAL | 0 refills | Status: DC
Start: 2023-06-23 — End: 2023-09-18

## 2023-06-23 NOTE — Assessment & Plan Note (Signed)
Chronic. Not well controlled.  Will start Amlodipine 5mg  daily.  Side effects and benefits of medication discussed during visit.  Repeat CMP checked at visit today.  Follow up in 1 month.

## 2023-06-23 NOTE — Progress Notes (Signed)
BP (!) 164/97 (BP Location: Right Arm, Patient Position: Sitting, Cuff Size: Large)   Pulse 76   Ht 5\' 5"  (1.651 m)   Wt 258 lb (117 kg)   LMP  (LMP Unknown)   SpO2 98%   BMI 42.93 kg/m    Subjective:    Patient ID: Hannah Edwards, female    DOB: February 04, 1951, 73 y.o.   MRN: [id number]  HPI: Hannah Edwards is a 73 y.o. female  Chief Complaint  Patient presents with   Follow-up    Follow-up HTN--home b/p elevated 168/93 Back pain--painful especially when sitting then stand, stiff. Taking tylenol.   HYPERTENSION / HYPERLIPIDEMIA Patient has an appt with Cardiology in April.  Satisfied with current treatment? yes Duration of hypertension: years BP monitoring frequency: daily BP range: 160/90 BP medication side effects: no Past BP meds: none Duration of hyperlipidemia: years Cholesterol medication side effects: no Cholesterol supplements: crestor weekly Past cholesterol medications: none Medication compliance: good compliance Aspirin: no Recent stressors: no Recurrent headaches: no Visual changes: no Palpitations: no Dyspnea: yes Chest pain: no Lower extremity edema: no Dizzy/lightheaded: yes     06/23/2023    1:00 PM  Results of the Epworth flowsheet  Sitting and reading 0  Watching TV 0  Sitting, inactive in a public place (e.g. a theatre or a meeting) 0  As a passenger in a car for an hour without a break 0  Lying down to rest in the afternoon when circumstances permit 0  Sitting and talking to someone 0  Sitting quietly after a lunch without alcohol 0  In a car, while stopped for a few minutes in traffic 0  Total score 0     Relevant past medical, surgical, family and social history reviewed and updated as indicated. Interim medical history since our last visit reviewed. Allergies and medications reviewed and updated.  Review of Systems  Constitutional:  Positive for fatigue.  Eyes:  Negative for visual disturbance.  Respiratory:  Positive for shortness of  breath. Negative for cough and chest tightness.   Cardiovascular:  Negative for chest pain, palpitations and leg swelling.  Neurological:  Positive for dizziness. Negative for headaches.    Per HPI unless specifically indicated above     Objective:    BP (!) 164/97 (BP Location: Right Arm, Patient Position: Sitting, Cuff Size: Large)   Pulse 76   Ht 5\' 5"  (1.651 m)   Wt 258 lb (117 kg)   LMP  (LMP Unknown)   SpO2 98%   BMI 42.93 kg/m   Wt Readings from Last 3 Encounters:  06/23/23 258 lb (117 kg)  06/09/23 259 lb 9.6 oz (117.8 kg)  07/21/22 270 lb (122.5 kg)    Physical Exam Vitals and nursing note reviewed.  Constitutional:      General: She is not in acute distress.    Appearance: Normal appearance. She is obese. She is not ill-appearing, toxic-appearing or diaphoretic.  HENT:     Head: Normocephalic.     Right Ear: External ear normal.     Left Ear: External ear normal.     Nose: Nose normal.     Mouth/Throat:     Mouth: Mucous membranes are moist.     Pharynx: Oropharynx is clear.  Eyes:     General:        Right eye: No discharge.        Left eye: No discharge.     Extraocular Movements: Extraocular movements intact.  Conjunctiva/sclera: Conjunctivae normal.     Pupils: Pupils are equal, round, and reactive to light.  Cardiovascular:     Rate and Rhythm: Normal rate and regular rhythm.     Heart sounds: No murmur heard. Pulmonary:     Effort: Pulmonary effort is normal. No respiratory distress.     Breath sounds: Normal breath sounds. No wheezing or rales.  Musculoskeletal:     Cervical back: Normal range of motion and neck supple.  Skin:    General: Skin is warm and dry.     Capillary Refill: Capillary refill takes less than 2 seconds.  Neurological:     General: No focal deficit present.     Mental Status: She is alert and oriented to person, place, and time. Mental status is at baseline.  Psychiatric:        Mood and Affect: Mood normal.         Behavior: Behavior normal.        Thought Content: Thought content normal.        Judgment: Judgment normal.     Results for orders placed or performed in visit on 06/09/23  CBC w/Diff   Collection Time: 06/09/23  2:10 PM  Result Value Ref Range   WBC 5.9 3.4 - 10.8 x10E3/uL   RBC 4.48 3.77 - 5.28 x10E6/uL   Hemoglobin 13.6 11.1 - 15.9 g/dL   Hematocrit 41.6 60.6 - 46.6 %   MCV 92 79 - 97 fL   MCH 30.4 26.6 - 33.0 pg   MCHC 32.9 31.5 - 35.7 g/dL   RDW 30.1 60.1 - 09.3 %   Platelets 305 150 - 450 x10E3/uL   Neutrophils 52 Not Estab. %   Lymphs 37 Not Estab. %   Monocytes 7 Not Estab. %   Eos 3 Not Estab. %   Basos 1 Not Estab. %   Neutrophils Absolute 3.1 1.4 - 7.0 x10E3/uL   Lymphocytes Absolute 2.2 0.7 - 3.1 x10E3/uL   Monocytes Absolute 0.4 0.1 - 0.9 x10E3/uL   EOS (ABSOLUTE) 0.2 0.0 - 0.4 x10E3/uL   Basophils Absolute 0.0 0.0 - 0.2 x10E3/uL   Immature Granulocytes 0 Not Estab. %   Immature Grans (Abs) 0.0 0.0 - 0.1 x10E3/uL  Comp Met (CMET)   Collection Time: 06/09/23  2:10 PM  Result Value Ref Range   Glucose 95 70 - 99 mg/dL   BUN 11 8 - 27 mg/dL   Creatinine, Ser 2.35 0.57 - 1.00 mg/dL   eGFR 71 >57 DU/KGU/5.42   BUN/Creatinine Ratio 13 12 - 28   Sodium 145 (H) 134 - 144 mmol/L   Potassium 4.2 3.5 - 5.2 mmol/L   Chloride 104 96 - 106 mmol/L   CO2 25 20 - 29 mmol/L   Calcium 9.6 8.7 - 10.3 mg/dL   Total Protein 6.7 6.0 - 8.5 g/dL   Albumin 4.0 3.8 - 4.8 g/dL   Globulin, Total 2.7 1.5 - 4.5 g/dL   Bilirubin Total 0.5 0.0 - 1.2 mg/dL   Alkaline Phosphatase 86 44 - 121 IU/L   AST 15 0 - 40 IU/L   ALT 11 0 - 32 IU/L  Lipid Profile   Collection Time: 06/09/23  2:10 PM  Result Value Ref Range   Cholesterol, Total 226 (H) 100 - 199 mg/dL   Triglycerides 69 0 - 149 mg/dL   HDL 61 >70 mg/dL   VLDL Cholesterol Cal 12 5 - 40 mg/dL   LDL Chol Calc (NIH) 623 (H) 0 -  99 mg/dL   Chol/HDL Ratio 3.7 0.0 - 4.4 ratio  HgB A1c   Collection Time: 06/09/23  2:10 PM   Result Value Ref Range   Hgb A1c MFr Bld 6.1 (H) 4.8 - 5.6 %   Est. average glucose Bld gHb Est-mCnc 128 mg/dL  Thyroid Panel With TSH   Collection Time: 06/09/23  2:10 PM  Result Value Ref Range   TSH 3.270 0.450 - 4.500 uIU/mL   T4, Total 9.3 4.5 - 12.0 ug/dL   T3 Uptake Ratio 29 24 - 39 %   Free Thyroxine Index 2.7 1.2 - 4.9  D-Dimer, Quantitative   Collection Time: 06/09/23  2:10 PM  Result Value Ref Range   D-DIMER 0.52 (H) 0.00 - 0.49 mg/L FEU  B Nat Peptide   Collection Time: 06/09/23  2:10 PM  Result Value Ref Range   BNP 46.6 0.0 - 100.0 pg/mL      Assessment & Plan:   Problem List Items Addressed This Visit       Cardiovascular and Mediastinum   Essential hypertension - Primary   Chronic. Not well controlled.  Will start Amlodipine 5mg  daily.  Side effects and benefits of medication discussed during visit.  Repeat CMP checked at visit today.  Follow up in 1 month.       Relevant Medications   amLODipine (NORVASC) 5 MG tablet   Other Relevant Orders   Comp Met (CMET)   Other Visit Diagnoses       Other fatigue       Epworth scale 0 in office. Keep appt for April with Cardiology.     Need for influenza vaccination       Relevant Orders   Flu Vaccine Trivalent High Dose (Fluad)        Follow up plan: Return in about 1 month (around 07/21/2023) for BP Check.

## 2023-06-24 ENCOUNTER — Encounter: Payer: Self-pay | Admitting: Nurse Practitioner

## 2023-06-24 LAB — COMPREHENSIVE METABOLIC PANEL
ALT: 9 [IU]/L (ref 0–32)
AST: 14 [IU]/L (ref 0–40)
Albumin: 4 g/dL (ref 3.8–4.8)
Alkaline Phosphatase: 84 [IU]/L (ref 44–121)
BUN/Creatinine Ratio: 14 (ref 12–28)
BUN: 12 mg/dL (ref 8–27)
Bilirubin Total: 0.4 mg/dL (ref 0.0–1.2)
CO2: 24 mmol/L (ref 20–29)
Calcium: 9.8 mg/dL (ref 8.7–10.3)
Chloride: 105 mmol/L (ref 96–106)
Creatinine, Ser: 0.85 mg/dL (ref 0.57–1.00)
Globulin, Total: 2.8 g/dL (ref 1.5–4.5)
Glucose: 88 mg/dL (ref 70–99)
Potassium: 4 mmol/L (ref 3.5–5.2)
Sodium: 143 mmol/L (ref 134–144)
Total Protein: 6.8 g/dL (ref 6.0–8.5)
eGFR: 72 mL/min/{1.73_m2} (ref >59)

## 2023-07-27 ENCOUNTER — Ambulatory Visit: Payer: Medicare Other

## 2023-07-27 DIAGNOSIS — Z0000 Encounter for general adult medical examination without abnormal findings: Secondary | ICD-10-CM | POA: Diagnosis not present

## 2023-07-27 NOTE — Progress Notes (Unsigned)
   LMP  (LMP Unknown)    Subjective:    Patient ID: Hannah Edwards, female    DOB: Aug 19, 1950, 73 y.o.   MRN: [id number]  HPI: Hannah Edwards is a 73 y.o. female  No chief complaint on file.  HYPERTENSION / HYPERLIPIDEMIA Patient has an appt with Cardiology in April.  Satisfied with current treatment? yes Duration of hypertension: years BP monitoring frequency: daily BP range: 160/90 BP medication side effects: no Past BP meds: none Duration of hyperlipidemia: years Cholesterol medication side effects: no Cholesterol supplements: crestor weekly Past cholesterol medications: none Medication compliance: good compliance Aspirin: no Recent stressors: no Recurrent headaches: no Visual changes: no Palpitations: no Dyspnea: yes Chest pain: no Lower extremity edema: no Dizzy/lightheaded: yes  Relevant past medical, surgical, family and social history reviewed and updated as indicated. Interim medical history since our last visit reviewed. Allergies and medications reviewed and updated.  Review of Systems  Per HPI unless specifically indicated above     Objective:    LMP  (LMP Unknown)   Wt Readings from Last 3 Encounters:  06/23/23 258 lb (117 kg)  06/09/23 259 lb 9.6 oz (117.8 kg)  07/21/22 270 lb (122.5 kg)    Physical Exam  Results for orders placed or performed in visit on 06/23/23  Comp Met (CMET)   Collection Time: 06/23/23  1:35 PM  Result Value Ref Range   Glucose 88 70 - 99 mg/dL   BUN 12 8 - 27 mg/dL   Creatinine, Ser 4.09 0.57 - 1.00 mg/dL   eGFR 72 >81 XB/JYN/8.29   BUN/Creatinine Ratio 14 12 - 28   Sodium 143 134 - 144 mmol/L   Potassium 4.0 3.5 - 5.2 mmol/L   Chloride 105 96 - 106 mmol/L   CO2 24 20 - 29 mmol/L   Calcium 9.8 8.7 - 10.3 mg/dL   Total Protein 6.8 6.0 - 8.5 g/dL   Albumin 4.0 3.8 - 4.8 g/dL   Globulin, Total 2.8 1.5 - 4.5 g/dL   Bilirubin Total 0.4 0.0 - 1.2 mg/dL   Alkaline Phosphatase 84 44 - 121 IU/L   AST 14 0 - 40 IU/L   ALT 9  0 - 32 IU/L      Assessment & Plan:   Problem List Items Addressed This Visit       Cardiovascular and Mediastinum   Essential hypertension - Primary     Follow up plan: No follow-ups on file.

## 2023-07-27 NOTE — Progress Notes (Signed)
 Subjective:   Hannah Edwards is a 73 y.o. who presents for a Medicare Wellness preventive visit.  Visit Complete: Virtual I connected with  [name] on 07/27/23 by a audio enabled telemedicine application and verified that I am speaking with the correct person using two identifiers.  Patient Location: Home  Provider Location: Office/Clinic  I discussed the limitations of evaluation and management by telemedicine. The patient expressed understanding and agreed to proceed.  Vital Signs: Because this visit was a virtual/telehealth visit, some criteria may be missing or patient reported. Any vitals not documented were not able to be obtained and vitals that have been documented are patient reported.  VideoError- Librarian, academic were attempted between this provider and patient, however failed, due to patient having technical difficulties OR patient did not have access to video capability.  We continued and completed visit with audio only.   Persons Participating in Visit: Patient.  AWV Questionnaire: No: Patient Medicare AWV questionnaire was not completed prior to this visit.  Cardiac Risk Factors include: advanced age (>48men, >10 women);dyslipidemia;hypertension     Objective:    Today's Vitals   There is no height or weight on file to calculate BMI.     07/27/2023   10:15 AM 07/21/2022   10:32 AM 07/11/2021   12:52 PM 07/09/2020    1:02 PM 07/04/2019    1:08 PM 06/30/2019    6:00 AM 06/29/2019   10:14 PM  Advanced Directives  Does Patient Have a Medical Advance Directive? No No No Yes No No No  Type of Advance Directive    Living will     Would patient like information on creating a medical advance directive?  No - Patient declined No - Patient declined   No - Patient declined     Current Medications (verified) Outpatient Encounter Medications as of 07/27/2023  Medication Sig   acetaminophen (TYLENOL) 500 MG tablet Take 1,000 mg by mouth every 6  (six) hours as needed.   amLODipine (NORVASC) 5 MG tablet Take 1 tablet (5 mg total) by mouth daily.   omeprazole (PRILOSEC) 20 MG capsule Take 1 capsule (20 mg total) by mouth daily.   rosuvastatin (CRESTOR) 10 MG tablet Take 1 tablet (10 mg total) by mouth once a week.   No facility-administered encounter medications on file as of 07/27/2023.    Allergies (verified) Ace inhibitors, Lisinopril, and Atorvastatin   History: History reviewed. No pertinent past medical history. Past Surgical History:  Procedure Laterality Date   ABDOMINAL HYSTERECTOMY     WRIST SURGERY Left    Family History  Problem Relation Age of Onset   Hypertension Mother    Cancer Mother        unknown type    Hypertension Father    Aneurysm Father    Stomach cancer Sister    Diabetes Brother    HIV/AIDS Brother    Breast cancer Neg Hx    Lung cancer Neg Hx    Social History   Socioeconomic History   Marital status: Widowed    Spouse name: Not on file   Number of children: Not on file   Years of education: Not on file   Highest education level: Bachelor's degree (e.g., BA, AB, BS)  Occupational History   Not on file  Tobacco Use   Smoking status: Former    Current packs/day: 0.00    Types: Cigarettes    Quit date: 01/05/1975    Years since quitting: 48.5  Smokeless tobacco: Never  Vaping Use   Vaping status: Never Used  Substance and Sexual Activity   Alcohol use: No    Alcohol/week: 0.0 standard drinks of alcohol   Drug use: No   Sexual activity: Not Currently  Other Topics Concern   Not on file  Social History Narrative   Working full time    Social Drivers of Corporate investment banker Strain: Low Risk  (07/27/2023)   Overall Financial Resource Strain (CARDIA)    Difficulty of Paying Living Expenses: Not hard at all  Food Insecurity: No Food Insecurity (07/27/2023)   Hunger Vital Sign    Worried About Running Out of Food in the Last Year: Never true    Ran Out of Food in the Last  Year: Never true  Transportation Needs: No Transportation Needs (07/27/2023)   PRAPARE - Administrator, Civil Service (Medical): No    Lack of Transportation (Non-Medical): No  Physical Activity: Inactive (07/27/2023)   Exercise Vital Sign    Days of Exercise per Week: 0 days    Minutes of Exercise per Session: 0 min  Stress: No Stress Concern Present (07/27/2023)   [employer] of Occupational Health - Occupational Stress Questionnaire    Feeling of Stress : Not at all  Social Connections: Moderately Isolated (07/27/2023)   Social Connection and Isolation Panel [NHANES]    Frequency of Communication with Friends and Family: More than three times a week    Frequency of Social Gatherings with Friends and Family: Twice a week    Attends Religious Services: More than 4 times per year    Active Member of [employer] or Organizations: No    Attends Banker Meetings: Never    Marital Status: Widowed    Tobacco Counseling Counseling given: Not Answered    Clinical Intake:  Pre-visit preparation completed: Yes  Pain : No/denies pain     Nutritional Risks: None Diabetes: No  Lab Results  Component Value Date   HGBA1C 6.1 (H) 06/09/2023   HGBA1C 6.2 (H) 03/18/2022   HGBA1C 6.3 (H) 09/11/2021     How often do you need to have someone help you when you read instructions, pamphlets, or other written materials from your doctor or pharmacy?: 1 - Never  Interpreter Needed?: No  Information entered by :: NAllen LPN   Activities of Daily Living     07/27/2023   10:09 AM  In your present state of health, do you have any difficulty performing the following activities:  Hearing? 0  Vision? 1  Comment blurry at times  Difficulty concentrating or making decisions? 0  Walking or climbing stairs? 0  Dressing or bathing? 0  Doing errands, shopping? 0  Preparing Food and eating ? N  Using the Toilet? N  In the past six months, have you accidently leaked  urine? N  Do you have problems with loss of bowel control? N  Managing your Medications? N  Managing your Finances? N  Housekeeping or managing your Housekeeping? N    Patient Care Team: Larae Grooms, NP as PCP - General  Indicate any recent Medical Services you may have received from other than [facility] providers in the past year (date may be approximate).     Assessment:   This is a routine wellness examination for Sanayah.  Hearing/Vision screen Hearing Screening - Comments:: Denies hearing issues Vision Screening - Comments:: No regular eye exams   Goals Addressed  This Visit's Progress    Patient Stated       07/27/2023, wants to get back together       Depression Screen     07/27/2023   10:18 AM 06/09/2023    1:27 PM 07/21/2022   10:30 AM 03/18/2022   11:03 AM 09/11/2021   11:01 AM 07/11/2021    1:10 PM 07/11/2021   12:53 PM  PHQ 2/9 Scores  PHQ - 2 Score 0 3 0 0 0 0 0  PHQ- 9 Score 2 8 0 0 0      Fall Risk     07/27/2023   10:17 AM 06/09/2023    1:26 PM 07/21/2022   10:32 AM 03/18/2022   11:03 AM 09/11/2021   11:01 AM  Fall Risk   Falls in the past year? 0 0 0 0 0  Number falls in past yr: 0 0 0 0 0  Injury with Fall? 0 0 0 0 0  Risk for fall due to : Medication side effect No Fall Risks No Fall Risks No Fall Risks No Fall Risks  Follow up Falls prevention discussed;Falls evaluation completed Falls evaluation completed Falls prevention discussed;Falls evaluation completed Falls evaluation completed Falls evaluation completed    MEDICARE RISK AT HOME:  Medicare Risk at Home Any stairs in or around the home?: Yes If so, are there any without handrails?: No Home free of loose throw rugs in walkways, pet beds, electrical cords, etc?: Yes Adequate lighting in your home to reduce risk of falls?: Yes Life alert?: No Use of a cane, walker or w/c?: No Grab bars in the bathroom?: No Shower chair or bench in shower?: No Elevated toilet seat or a  handicapped toilet?: Yes  TIMED UP AND GO:  Was the test performed?  No  Cognitive Function: 6CIT completed        07/27/2023   10:19 AM 07/21/2022   10:36 AM 07/09/2020    1:05 PM 01/13/2018    1:18 PM  6CIT Screen  What Year? 0 points 0 points 0 points 0 points  What month? 0 points 0 points 0 points 0 points  What time? 0 points 0 points 0 points 0 points  Count back from 20 0 points 0 points 0 points 0 points  Months in reverse 0 points 0 points 0 points 0 points  Repeat phrase 0 points 0 points 0 points 0 points  Total Score 0 points 0 points 0 points 0 points    Immunizations Immunization History  Administered Date(s) Administered   Fluad Quad(high Dose 65+) 02/08/2019, 02/29/2020, 02/13/2021, 03/15/2022   Fluad Trivalent(High Dose 65+) 06/23/2023   Influenza, High Dose Seasonal PF 01/13/2018   Influenza,inj,quad, With Preservative 03/15/2022   Influenza-Unspecified 02/12/2015   Moderna Covid-19 Fall Seasonal Vaccine 14yrs & older 03/15/2022   Moderna Sars-Covid-2 Vaccination 03/15/2022   PFIZER Comirnaty(Gray Top)Covid-19 Tri-Sucrose Vaccine 10/17/2020   PFIZER(Purple Top)SARS-COV-2 Vaccination 09/28/2019, 10/19/2019, 04/11/2020   PNEUMOCOCCAL CONJUGATE-20 09/02/2022   PPD Test 09/09/2013   Pneumococcal Conjugate-13 12/10/2016   Pneumococcal Polysaccharide-23 01/13/2018   Respiratory Syncytial Virus Vaccine,Recomb Aduvanted(Arexvy) 09/02/2022   Zoster, Live 06/17/2010    Screening Tests Health Maintenance  Topic Date Due   DTaP/Tdap/Td (1 - Tdap) Never done   Zoster Vaccines- Shingrix (1 of 2) 05/23/1999   MAMMOGRAM  04/03/2022   COVID-19 Vaccine (6 - 2024-25 season) 01/04/2023   Medicare Annual Wellness (AWV)  07/26/2024   Fecal DNA (Cologuard)  07/29/2024   Pneumonia Vaccine 71+ Years old  Completed   INFLUENZA VACCINE  Completed   DEXA SCAN  Completed   Hepatitis C Screening  Completed   HPV VACCINES  Aged Out   Colonoscopy  Discontinued    Health  Maintenance  Health Maintenance Due  Topic Date Due   DTaP/Tdap/Td (1 - Tdap) Never done   Zoster Vaccines- Shingrix (1 of 2) 05/23/1999   MAMMOGRAM  04/03/2022   COVID-19 Vaccine (6 - 2024-25 season) 01/04/2023   Health Maintenance Items Addressed: Due for TDAP, Covid and Shingrix. Declines mammogram  Additional Screening:  Vision Screening: Recommended annual ophthalmology exams for early detection of glaucoma and other disorders of the eye.  Dental Screening: Recommended annual dental exams for proper oral hygiene  Community Resource Referral / Chronic Care Management: CRR required this visit?  No   CCM required this visit?  No     Plan:     I have personally reviewed and noted the following in the patient's chart:   Medical and social history Use of alcohol, tobacco or illicit drugs  Current medications and supplements including opioid prescriptions. Patient is not currently taking opioid prescriptions. Functional ability and status Nutritional status Physical activity Advanced directives List of other physicians Hospitalizations, surgeries, and ER visits in previous 12 months Vitals Screenings to include cognitive, depression, and falls Referrals and appointments  In addition, I have reviewed and discussed with patient certain preventive protocols, quality metrics, and best practice recommendations. A written personalized care plan for preventive services as well as general preventive health recommendations were provided to patient.     Barb Merino, LPN   [id number]   After Visit Summary: (MyChart) Due to this being a telephonic visit, the after visit summary with patients personalized plan was offered to patient via MyChart   Notes: Nothing significant to report at this time.

## 2023-07-27 NOTE — Patient Instructions (Signed)
 Hannah Edwards , Thank you for taking time to come for your Medicare Wellness Visit. I appreciate your ongoing commitment to your health goals. Please review the following plan we discussed and let me know if I can assist you in the future.   Referrals/Orders/Follow-Ups/Clinician Recommendations: none  This is a list of the screening recommended for you and due dates:  Health Maintenance  Topic Date Due   DTaP/Tdap/Td vaccine (1 - Tdap) Never done   Zoster (Shingles) Vaccine (1 of 2) 05/23/1999   Mammogram  04/03/2022   COVID-19 Vaccine (6 - 2024-25 season) 01/04/2023   Medicare Annual Wellness Visit  07/26/2024   Cologuard (Stool DNA test)  07/29/2024   Pneumonia Vaccine  Completed   Flu Shot  Completed   DEXA scan (bone density measurement)  Completed   Hepatitis C Screening  Completed   HPV Vaccine  Aged Out   Colon Cancer Screening  Discontinued    Advanced directives: (ACP Link)Information on Advanced Care Planning can be found at [facility] Advance Health Care Directives Advance Health Care Directives. [email or web address]   Next Medicare Annual Wellness Visit scheduled for next year: Yes  insert Preventive Care attachment Insert FALL PREVENTION attachment if needed

## 2023-07-28 ENCOUNTER — Encounter: Payer: Self-pay | Admitting: Nurse Practitioner

## 2023-07-28 ENCOUNTER — Ambulatory Visit (INDEPENDENT_AMBULATORY_CARE_PROVIDER_SITE_OTHER): Payer: Medicare Other | Admitting: Nurse Practitioner

## 2023-07-28 VITALS — BP 138/81 | HR 76 | Ht 65.0 in | Wt 260.8 lb

## 2023-07-28 DIAGNOSIS — I10 Essential (primary) hypertension: Secondary | ICD-10-CM

## 2023-07-28 NOTE — Assessment & Plan Note (Signed)
 Chronic.  Controlled.  Continue with current medication regimen of Amlodipine 5mg .  Return to clinic in 3 months for reevaluation.  Call sooner if concerns arise.

## 2023-08-26 ENCOUNTER — Encounter: Payer: Self-pay | Admitting: Cardiology

## 2023-08-26 ENCOUNTER — Encounter: Payer: Self-pay | Admitting: Nurse Practitioner

## 2023-08-26 ENCOUNTER — Ambulatory Visit: Payer: Medicare Other | Attending: Cardiology | Admitting: Cardiology

## 2023-08-26 ENCOUNTER — Telehealth: Payer: Self-pay | Admitting: Nurse Practitioner

## 2023-08-26 VITALS — BP 142/88 | HR 97 | Ht 65.0 in | Wt 264.4 lb

## 2023-08-26 DIAGNOSIS — I10 Essential (primary) hypertension: Secondary | ICD-10-CM | POA: Diagnosis not present

## 2023-08-26 DIAGNOSIS — R072 Precordial pain: Secondary | ICD-10-CM | POA: Diagnosis not present

## 2023-08-26 DIAGNOSIS — E7800 Pure hypercholesterolemia, unspecified: Secondary | ICD-10-CM | POA: Insufficient documentation

## 2023-08-26 NOTE — Progress Notes (Signed)
 Cardiology Office Note:    Date:  08/26/2023   ID:  Hannah Edwards, DOB 1951/03/01, MRN [id number]  PCP:  Hannah Alexanders, NP   [facility] Providers Cardiologist:  None     Referring MD: Hannah Alexanders, NP   No chief complaint on file.   History of Present Illness:    Hannah Edwards is a 73 y.o. female with a hx of hypertension, hyperlipidemia, former smoker,  GERD who presents due to chest pain.  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  She states having back aches shoulder and chest discomfort about 4 months ago.  Chest discomfort was precipitated by arm movements.  She has been away from work over the past 4 months, resting.  Symptoms of all resolved.  She endorses shortness of breath when she overexerts herself.  Attributes breathing and back pain issues to being overweight.  No past medical history on file.  Past Surgical History:  Procedure Laterality Date   ABDOMINAL HYSTERECTOMY     WRIST SURGERY Left     Current Medications: Current Meds  Medication Sig   acetaminophen  (TYLENOL ) 500 MG tablet Take 1,000 mg by mouth every 6 (six) hours as needed.   amLODipine  (NORVASC ) 5 MG tablet Take 1 tablet (5 mg total) by mouth daily.   omeprazole  (PRILOSEC) 20 MG capsule Take 1 capsule (20 mg total) by mouth daily.   rosuvastatin  (CRESTOR ) 10 MG tablet Take 1 tablet (10 mg total) by mouth once a week.     Allergies:   Ace inhibitors, Lisinopril, and Atorvastatin    Social History   Socioeconomic History   Marital status: Widowed    Spouse name: Not on file   Number of children: Not on file   Years of education: Not on file   Highest education level: Bachelor's degree (e.g., BA, AB, BS)  Occupational History   Not on file  Tobacco Use   Smoking status: Former    Current packs/day: 0.00    Types: Cigarettes    Quit date: 01/05/1975    Years since quitting: 48.6   Smokeless tobacco: Never   Vaping Use   Vaping status: Never Used  Substance and Sexual Activity   Alcohol use: No    Alcohol/week: 0.0 standard drinks of alcohol   Drug use: No   Sexual activity: Not Currently  Other Topics Concern   Not on file  Social History Narrative   Working full time    Social Drivers of Corporate investment banker Strain: Low Risk  (07/27/2023)   Overall Financial Resource Strain (CARDIA)    Difficulty of Paying Living Expenses: Not hard at all  Food Insecurity: No Food Insecurity (07/27/2023)   Hunger Vital Sign    Worried About Running Out of Food in the Last Year: Never true    Ran Out of Food in the Last Year: Never true  Transportation Needs: No Transportation Needs (07/27/2023)   PRAPARE - Administrator, Civil Service (Medical): No    Lack of Transportation (Non-Medical): No  Physical Activity: Inactive (07/27/2023)   Exercise Vital Sign    Days of Exercise per Week: 0 days    Minutes of Exercise per Session: 0 min  Stress: No Stress Concern Present (07/27/2023)   [employer] of Occupational Health - Occupational Stress Questionnaire    Feeling of Stress : Not at all  Social Connections: Moderately Isolated (07/27/2023)   Social Connection and Isolation  Panel [NHANES]    Frequency of Communication with Friends and Family: More than three times a week    Frequency of Social Gatherings with Friends and Family: Twice a week    Attends Religious Services: More than 4 times per year    Active Member of [employer] or Organizations: No    Attends Banker Meetings: Never    Marital Status: Widowed     Family History: The patient's family history includes Aneurysm in her father; Cancer in her mother; Diabetes in her brother; HIV/AIDS in her brother; Hypertension in her father and mother; Stomach cancer in her sister. There is no history of Breast cancer or Lung cancer.  ROS:   Please see the history of present illness.     All other systems reviewed  and are negative.  EKGs/Labs/Other Studies Reviewed:    The following studies were reviewed today:  EKG Interpretation Date/Time:  Wednesday August 26 2023 11:02:53 EDT Ventricular Rate:  97 PR Interval:  116 QRS Duration:  70 QT Interval:  130 QTC Calculation: 165 R Axis:   -33  Text Interpretation: Normal sinus rhythm Left axis deviation Nonspecific T wave abnormality Confirmed by Constancia Delton ([id number]) on 08/26/2023 11:10:40 AM    Recent Labs: 06/09/2023: BNP 46.6; Hemoglobin 13.6; Platelets 305; TSH 3.270 06/23/2023: ALT 9; BUN 12; Creatinine, Ser 0.85; Potassium 4.0; Sodium 143  Recent Lipid Panel    Component Value Date/Time   CHOL 226 (H) 06/09/2023 1410   CHOL 186 01/05/2015 1028   TRIG 69 06/09/2023 1410   TRIG 73 01/05/2015 1028   HDL 61 06/09/2023 1410   CHOLHDL 3.7 06/09/2023 1410   VLDL 15 01/05/2015 1028   LDLCALC 153 (H) 06/09/2023 1410     Risk Assessment/Calculations:          Physical Exam:    VS:  BP (!) 142/88   Pulse 97   Ht 5\' 5"  (1.651 m)   Wt 264 lb 6.4 oz (119.9 kg)   LMP  (LMP Unknown)   SpO2 98%   BMI 44.00 kg/m     Wt Readings from Last 3 Encounters:  08/26/23 264 lb 6.4 oz (119.9 kg)  07/28/23 260 lb 12.8 oz (118.3 kg)  06/23/23 258 lb (117 kg)     GEN:  Well nourished, well developed in no acute distress HEENT: Normal NECK: No JVD; No carotid bruits CARDIAC: RRR, no murmurs, rubs, gallops RESPIRATORY:  Clear to auscultation without rales, wheezing or rhonchi  ABDOMEN: Soft, non-tender, non-distended MUSCULOSKELETAL:  No edema; No deformity  SKIN: Warm and dry NEUROLOGIC:  Alert and oriented x 3 PSYCHIATRIC:  Normal affect   ASSESSMENT:    1. Precordial pain   2. Primary hypertension   3. Pure hypercholesterolemia   4. Morbid obesity (HCC)    PLAN:    In order of problems listed above:  Chest pain, etiology appears musculoskeletal.  Symptoms resolved with patient being away from work.  Has some cardiac risk  factors.  Obtain echo to rule out any structural abnormalities.  Okay to return to work from a cardiac standpoint. Hypertension, BP elevated today, usually controlled.  Continue Norvasc  5 mg daily. Hyperlipidemia, cholesterol recently increased by PCP.  Continue Crestor  10 mg daily. Morbid obesity, low-calorie diet, increased activity, weight loss advised.  Follow-up after echo      Medication Adjustments/Labs and Tests Ordered: Current medicines are reviewed at length with the patient today.  Concerns regarding medicines are outlined above.  Orders Placed This  Encounter  Procedures   EKG 12-Lead   ECHOCARDIOGRAM COMPLETE   No orders of the defined types were placed in this encounter.   Patient Instructions  Medication Instructions:  Continue same medications  Lab Work: None ordered  Testing/Procedures: Echo first available  Follow-Up: At [facility], you and your health needs are our priority.  As part of our continuing mission to provide you with exceptional heart care, our providers are all part of one team.  This team includes your primary Cardiologist (physician) and Advanced Practice Providers or APPs (Physician Assistants and Nurse Practitioners) who all work together to provide you with the care you need, when you need it.  Your next appointment:  3 months    Provider:  Dr.Agbor-Etang   We recommend signing up for the patient portal called "MyChart".  Sign up information is provided on this After Visit Summary.  MyChart is used to connect with patients for Virtual Visits (Telemedicine).  Patients are able to view lab/test results, encounter notes, upcoming appointments, etc.  Non-urgent messages can be sent to your provider as well.   To learn more about what you can do with MyChart, go to [email or web address].            Signed, Constancia Delton, MD  08/26/2023 12:48 PM    [facility]

## 2023-08-26 NOTE — Telephone Encounter (Signed)
 Routing to provider to advise.

## 2023-08-26 NOTE — Patient Instructions (Signed)
 Medication Instructions:  Continue same medications  Lab Work: None ordered  Testing/Procedures: Echo first available  Follow-Up: At [facility], you and your health needs are our priority.  As part of our continuing mission to provide you with exceptional heart care, our providers are all part of one team.  This team includes your primary Cardiologist (physician) and Advanced Practice Providers or APPs (Physician Assistants and Nurse Practitioners) who all work together to provide you with the care you need, when you need it.  Your next appointment:  3 months    Provider:  Dr.Agbor-Etang   We recommend signing up for the patient portal called "MyChart".  Sign up information is provided on this After Visit Summary.  MyChart is used to connect with patients for Virtual Visits (Telemedicine).  Patients are able to view lab/test results, encounter notes, upcoming appointments, etc.  Non-urgent messages can be sent to your provider as well.   To learn more about what you can do with MyChart, go to [email or web address].

## 2023-08-26 NOTE — Telephone Encounter (Signed)
 Patient came into office requesting letter to return to work with no restrictions. Stated she seen her Cardiologist today and that everything looked fine. Requesting letter sent to her my chart and fax to her job. [employer]. Fax number [phone]. Please advise [phone]

## 2023-08-26 NOTE — Telephone Encounter (Signed)
 Okay to write letter for patient to work without restrictions.

## 2023-08-26 NOTE — Telephone Encounter (Signed)
 Letter typed and sent to patient's mychart. Copy also faxed to [employer] as requested by the patient.   Called and notified patient that this was done for her.

## 2023-08-27 ENCOUNTER — Telehealth: Payer: Self-pay | Admitting: Nurse Practitioner

## 2023-08-27 NOTE — Telephone Encounter (Signed)
 Letter has already been written. Please print it out for patient and call her and let her know it is ready.

## 2023-08-27 NOTE — Telephone Encounter (Signed)
 This was completed yesterday, see other phone encounter. Letter was sent to mychart and faxed in as requested by the patient. Patient was notified that this was done for her.

## 2023-08-27 NOTE — Telephone Encounter (Signed)
 Copied from CRM [id number]. Topic: General - Other >> Aug 26, 2023 11:53 AM Georgeann Kindred wrote: Reason for CRM: Patient calling to obtain a letter from NP Aileen Alexanders stating that she can go back to work without any restrictions. Please contact patient at [phone].

## 2023-09-17 ENCOUNTER — Other Ambulatory Visit: Payer: Self-pay | Admitting: Nurse Practitioner

## 2023-09-18 NOTE — Telephone Encounter (Signed)
 Requested Prescriptions  Pending Prescriptions Disp Refills   amLODipine  (NORVASC ) 5 MG tablet [Pharmacy Med Name: amLODIPine  Besylate 5 MG Oral Tablet] 90 tablet 0    Sig: Take 1 tablet by mouth once daily     Cardiovascular: Calcium  Channel Blockers 2 Failed - 09/18/2023  1:53 PM      Failed - Last BP in normal range    BP Readings from Last 1 Encounters:  08/26/23 (!) 142/88         Passed - Last Heart Rate in normal range    Pulse Readings from Last 1 Encounters:  08/26/23 97         Passed - Valid encounter within last 6 months    Recent Outpatient Visits           1 month ago Essential hypertension   [facility] Aileen Alexanders, NP   2 months ago Essential hypertension   [facility] Aileen Alexanders, NP   3 months ago Stable angina [facility])   [place] [facility] Hadassah Letters, MD       Future Appointments             In 2 months Agbor-Etang, Polly Brink, MD [facility]

## 2023-10-01 ENCOUNTER — Ambulatory Visit: Attending: Cardiology

## 2023-10-19 ENCOUNTER — Ambulatory Visit: Attending: Cardiology

## 2023-11-03 ENCOUNTER — Ambulatory Visit (INDEPENDENT_AMBULATORY_CARE_PROVIDER_SITE_OTHER): Admitting: Nurse Practitioner

## 2023-11-03 NOTE — Progress Notes (Deleted)
 LMP  (LMP Unknown)    Subjective:    Patient ID: Hannah Edwards, female    DOB: 1950-10-07, 73 y.o.   MRN: [id number]  HPI: [name] is a 73 y.o. female  No chief complaint on file.  HYPERTENSION / HYPERLIPIDEMIA Patient has an appt with Cardiology in April. Patient started amlodipine  about a month ago.   Satisfied with current treatment? yes Duration of hypertension: years BP monitoring frequency: daily BP range: 160/90 BP medication side effects: no Past BP meds: none Duration of hyperlipidemia: years Cholesterol medication side effects: no Cholesterol supplements: crestor  weekly Past cholesterol medications: none Medication compliance: good compliance Aspirin : no Recent stressors: no Recurrent headaches: no Visual changes: no Palpitations: no Dyspnea: yes Chest pain: no Lower extremity edema: no Dizzy/lightheaded: yes  Relevant past medical, surgical, family and social history reviewed and updated as indicated. Interim medical history since our last visit reviewed. Allergies and medications reviewed and updated.  Review of Systems  Eyes:  Negative for visual disturbance.  Respiratory:  Negative for cough, chest tightness and shortness of breath.   Cardiovascular:  Negative for chest pain, palpitations and leg swelling.  Neurological:  Negative for dizziness and headaches.    Per HPI unless specifically indicated above     Objective:    LMP  (LMP Unknown)   Wt Readings from Last 3 Encounters:  08/26/23 264 lb 6.4 oz (119.9 kg)  07/28/23 260 lb 12.8 oz (118.3 kg)  06/23/23 258 lb (117 kg)    Physical Exam Vitals and nursing note reviewed.  Constitutional:      General: She is not in acute distress.    Appearance: Normal appearance. She is obese. She is not ill-appearing, toxic-appearing or diaphoretic.  HENT:     Head: Normocephalic.     Right Ear: External ear normal.     Left Ear: External ear normal.     Nose: Nose normal.     Mouth/Throat:      Mouth: Mucous membranes are moist.     Pharynx: Oropharynx is clear.   Eyes:     General:        Right eye: No discharge.        Left eye: No discharge.     Extraocular Movements: Extraocular movements intact.     Conjunctiva/sclera: Conjunctivae normal.     Pupils: Pupils are equal, round, and reactive to light.    Cardiovascular:     Rate and Rhythm: Normal rate and regular rhythm.     Heart sounds: No murmur heard. Pulmonary:     Effort: Pulmonary effort is normal. No respiratory distress.     Breath sounds: Normal breath sounds. No wheezing or rales.   Musculoskeletal:     Cervical back: Normal range of motion and neck supple.   Skin:    General: Skin is warm and dry.     Capillary Refill: Capillary refill takes less than 2 seconds.   Neurological:     General: No focal deficit present.     Mental Status: She is alert and oriented to person, place, and time. Mental status is at baseline.   Psychiatric:        Mood and Affect: Mood normal.        Behavior: Behavior normal.        Thought Content: Thought content normal.        Judgment: Judgment normal.     Results for orders placed or performed in visit on 06/23/23  Comp Met (  CMET)   Collection Time: 06/23/23  1:35 PM  Result Value Ref Range   Glucose 88 70 - 99 mg/dL   BUN 12 8 - 27 mg/dL   Creatinine, Ser 9.14 0.57 - 1.00 mg/dL   eGFR 72 >40 fO/fpw/8.26   BUN/Creatinine Ratio 14 12 - 28   Sodium 143 134 - 144 mmol/L   Potassium 4.0 3.5 - 5.2 mmol/L   Chloride 105 96 - 106 mmol/L   CO2 24 20 - 29 mmol/L   Calcium  9.8 8.7 - 10.3 mg/dL   Total Protein 6.8 6.0 - 8.5 g/dL   Albumin 4.0 3.8 - 4.8 g/dL   Globulin, Total 2.8 1.5 - 4.5 g/dL   Bilirubin Total 0.4 0.0 - 1.2 mg/dL   Alkaline Phosphatase 84 44 - 121 IU/L   AST 14 0 - 40 IU/L   ALT 9 0 - 32 IU/L      Assessment & Plan:   Problem List Items Addressed This Visit       Cardiovascular and Mediastinum   Essential hypertension     Endocrine    IFG (impaired fasting glucose)     Other   Morbid obesity (HCC) - Primary   Hyperlipidemia     Follow up plan: No follow-ups on file.

## 2023-12-02 ENCOUNTER — Ambulatory Visit: Attending: Cardiology | Admitting: Cardiology

## 2024-03-29 ENCOUNTER — Ambulatory Visit: Attending: Cardiology

## 2024-04-11 ENCOUNTER — Ambulatory Visit: Attending: Cardiology | Admitting: Cardiology

## 2024-08-02 ENCOUNTER — Ambulatory Visit
